# Patient Record
Sex: Male | Born: 1940 | Race: White | Hispanic: No | Smoking: Former smoker
Health system: Southern US, Community
[De-identification: ages and names within clinical notes are randomized; demographics above are authoritative.]

## PROBLEM LIST (undated history)

## (undated) DIAGNOSIS — I219 Acute myocardial infarction, unspecified: Secondary | ICD-10-CM

## (undated) HISTORY — PX: CARDIAC SURGERY: SHX584

## (undated) HISTORY — PX: BACK SURGERY: SHX140

---

## 2006-04-25 ENCOUNTER — Encounter: Admission: RE | Admit: 2006-04-25 | Discharge: 2006-04-25 | Payer: Self-pay | Admitting: Neurosurgery

## 2015-07-24 DIAGNOSIS — F32A Depression, unspecified: Secondary | ICD-10-CM | POA: Insufficient documentation

## 2015-07-24 DIAGNOSIS — K219 Gastro-esophageal reflux disease without esophagitis: Secondary | ICD-10-CM | POA: Diagnosis present

## 2015-07-24 DIAGNOSIS — I252 Old myocardial infarction: Secondary | ICD-10-CM | POA: Insufficient documentation

## 2015-07-24 DIAGNOSIS — F319 Bipolar disorder, unspecified: Secondary | ICD-10-CM | POA: Insufficient documentation

## 2015-07-24 DIAGNOSIS — E039 Hypothyroidism, unspecified: Secondary | ICD-10-CM | POA: Diagnosis present

## 2015-08-15 DIAGNOSIS — I2510 Atherosclerotic heart disease of native coronary artery without angina pectoris: Secondary | ICD-10-CM | POA: Diagnosis present

## 2015-08-15 DIAGNOSIS — J449 Chronic obstructive pulmonary disease, unspecified: Secondary | ICD-10-CM | POA: Diagnosis present

## 2015-08-15 DIAGNOSIS — I10 Essential (primary) hypertension: Secondary | ICD-10-CM | POA: Diagnosis present

## 2015-08-18 DIAGNOSIS — E785 Hyperlipidemia, unspecified: Secondary | ICD-10-CM | POA: Diagnosis present

## 2015-09-04 DIAGNOSIS — G2581 Restless legs syndrome: Secondary | ICD-10-CM | POA: Insufficient documentation

## 2015-09-04 DIAGNOSIS — M4800 Spinal stenosis, site unspecified: Secondary | ICD-10-CM | POA: Diagnosis present

## 2015-12-24 DIAGNOSIS — F3177 Bipolar disorder, in partial remission, most recent episode mixed: Secondary | ICD-10-CM | POA: Insufficient documentation

## 2017-02-10 DIAGNOSIS — G5603 Carpal tunnel syndrome, bilateral upper limbs: Secondary | ICD-10-CM | POA: Insufficient documentation

## 2018-06-23 DIAGNOSIS — N529 Male erectile dysfunction, unspecified: Secondary | ICD-10-CM | POA: Insufficient documentation

## 2019-07-19 DIAGNOSIS — Z955 Presence of coronary angioplasty implant and graft: Secondary | ICD-10-CM

## 2019-07-19 DIAGNOSIS — I252 Old myocardial infarction: Secondary | ICD-10-CM | POA: Insufficient documentation

## 2019-09-14 DIAGNOSIS — D529 Folate deficiency anemia, unspecified: Secondary | ICD-10-CM | POA: Insufficient documentation

## 2019-12-14 ENCOUNTER — Encounter (HOSPITAL_COMMUNITY): Payer: Self-pay | Admitting: *Deleted

## 2019-12-14 ENCOUNTER — Emergency Department (HOSPITAL_COMMUNITY): Payer: Medicare HMO

## 2019-12-14 ENCOUNTER — Inpatient Hospital Stay (HOSPITAL_COMMUNITY)
Admission: AD | Admit: 2019-12-14 | Discharge: 2019-12-16 | DRG: 872 | Disposition: A | Discharge status: Home / Self Care | Payer: Medicare HMO | Attending: Internal Medicine | Admitting: Internal Medicine

## 2019-12-14 DIAGNOSIS — E039 Hypothyroidism, unspecified: Secondary | ICD-10-CM | POA: Diagnosis present

## 2019-12-14 DIAGNOSIS — R05 Cough: Secondary | ICD-10-CM | POA: Diagnosis present

## 2019-12-14 DIAGNOSIS — Z66 Do not resuscitate: Secondary | ICD-10-CM | POA: Diagnosis present

## 2019-12-14 DIAGNOSIS — K8020 Calculus of gallbladder without cholecystitis without obstruction: Secondary | ICD-10-CM | POA: Diagnosis present

## 2019-12-14 DIAGNOSIS — E785 Hyperlipidemia, unspecified: Secondary | ICD-10-CM | POA: Diagnosis present

## 2019-12-14 DIAGNOSIS — J449 Chronic obstructive pulmonary disease, unspecified: Secondary | ICD-10-CM | POA: Diagnosis present

## 2019-12-14 DIAGNOSIS — Z79890 Hormone replacement therapy (postmenopausal): Secondary | ICD-10-CM

## 2019-12-14 DIAGNOSIS — A419 Sepsis, unspecified organism: Secondary | ICD-10-CM | POA: Diagnosis not present

## 2019-12-14 DIAGNOSIS — R112 Nausea with vomiting, unspecified: Secondary | ICD-10-CM

## 2019-12-14 DIAGNOSIS — Z20822 Contact with and (suspected) exposure to covid-19: Secondary | ICD-10-CM | POA: Diagnosis present

## 2019-12-14 DIAGNOSIS — F17200 Nicotine dependence, unspecified, uncomplicated: Secondary | ICD-10-CM | POA: Diagnosis present

## 2019-12-14 DIAGNOSIS — R197 Diarrhea, unspecified: Secondary | ICD-10-CM

## 2019-12-14 DIAGNOSIS — G2581 Restless legs syndrome: Secondary | ICD-10-CM | POA: Diagnosis present

## 2019-12-14 DIAGNOSIS — Z880 Allergy status to penicillin: Secondary | ICD-10-CM

## 2019-12-14 DIAGNOSIS — M4800 Spinal stenosis, site unspecified: Secondary | ICD-10-CM | POA: Diagnosis present

## 2019-12-14 DIAGNOSIS — K219 Gastro-esophageal reflux disease without esophagitis: Secondary | ICD-10-CM | POA: Diagnosis present

## 2019-12-14 DIAGNOSIS — R350 Frequency of micturition: Secondary | ICD-10-CM | POA: Diagnosis present

## 2019-12-14 DIAGNOSIS — Z955 Presence of coronary angioplasty implant and graft: Secondary | ICD-10-CM

## 2019-12-14 DIAGNOSIS — Z87440 Personal history of urinary (tract) infections: Secondary | ICD-10-CM

## 2019-12-14 DIAGNOSIS — R7401 Elevation of levels of liver transaminase levels: Secondary | ICD-10-CM | POA: Diagnosis present

## 2019-12-14 DIAGNOSIS — I2510 Atherosclerotic heart disease of native coronary artery without angina pectoris: Secondary | ICD-10-CM | POA: Diagnosis present

## 2019-12-14 DIAGNOSIS — I10 Essential (primary) hypertension: Secondary | ICD-10-CM | POA: Diagnosis present

## 2019-12-14 DIAGNOSIS — I252 Old myocardial infarction: Secondary | ICD-10-CM

## 2019-12-14 DIAGNOSIS — R509 Fever, unspecified: Secondary | ICD-10-CM | POA: Diagnosis not present

## 2019-12-14 DIAGNOSIS — Z888 Allergy status to other drugs, medicaments and biological substances status: Secondary | ICD-10-CM

## 2019-12-14 DIAGNOSIS — R399 Unspecified symptoms and signs involving the genitourinary system: Secondary | ICD-10-CM

## 2019-12-14 DIAGNOSIS — A071 Giardiasis [lambliasis]: Secondary | ICD-10-CM | POA: Diagnosis present

## 2019-12-14 DIAGNOSIS — R1013 Epigastric pain: Secondary | ICD-10-CM | POA: Diagnosis not present

## 2019-12-14 DIAGNOSIS — Z79899 Other long term (current) drug therapy: Secondary | ICD-10-CM | POA: Diagnosis not present

## 2019-12-14 DIAGNOSIS — R059 Cough, unspecified: Secondary | ICD-10-CM

## 2019-12-14 HISTORY — DX: Acute myocardial infarction, unspecified: I21.9

## 2019-12-14 LAB — CBC
HCT: 39.8 % (ref 39.0–52.0)
Hemoglobin: 14.4 g/dL (ref 13.0–17.0)
MCH: 36.8 pg — ABNORMAL HIGH (ref 26.0–34.0)
MCHC: 36.2 g/dL — ABNORMAL HIGH (ref 30.0–36.0)
MCV: 101.8 fL — ABNORMAL HIGH (ref 80.0–100.0)
Platelets: 125 10*3/uL — ABNORMAL LOW (ref 150–400)
RBC: 3.91 MIL/uL — ABNORMAL LOW (ref 4.22–5.81)
RDW: 13.2 % (ref 11.5–15.5)
WBC: 10.2 10*3/uL (ref 4.0–10.5)
nRBC: 0 % (ref 0.0–0.2)

## 2019-12-14 LAB — COMPREHENSIVE METABOLIC PANEL
ALT: 58 U/L — ABNORMAL HIGH (ref 0–44)
AST: 61 U/L — ABNORMAL HIGH (ref 15–41)
Albumin: 3.6 g/dL (ref 3.5–5.0)
Alkaline Phosphatase: 46 U/L (ref 38–126)
Anion gap: 11 (ref 5–15)
BUN: 18 mg/dL (ref 8–23)
CO2: 26 mmol/L (ref 22–32)
Calcium: 8.3 mg/dL — ABNORMAL LOW (ref 8.9–10.3)
Chloride: 94 mmol/L — ABNORMAL LOW (ref 98–111)
Creatinine, Ser: 1.19 mg/dL (ref 0.61–1.24)
GFR calc Af Amer: >60 mL/min (ref >60)
GFR calc non Af Amer: 57 mL/min — ABNORMAL LOW (ref >60)
Glucose, Bld: 146 mg/dL — ABNORMAL HIGH (ref 70–99)
Potassium: 3.7 mmol/L (ref 3.5–5.1)
Sodium: 131 mmol/L — ABNORMAL LOW (ref 135–145)
Total Bilirubin: 1.4 mg/dL — ABNORMAL HIGH (ref 0.3–1.2)
Total Protein: 6.5 g/dL (ref 6.5–8.1)

## 2019-12-14 LAB — LACTIC ACID, PLASMA
Lactic Acid, Venous: 2.4 mmol/L (ref 0.5–1.9)
Lactic Acid, Venous: 0.9 mmol/L (ref 0.5–1.9)

## 2019-12-14 LAB — URINALYSIS, ROUTINE W REFLEX MICROSCOPIC
Bilirubin Urine: NEGATIVE
Glucose, UA: NEGATIVE mg/dL
Hgb urine dipstick: NEGATIVE
Ketones, ur: NEGATIVE mg/dL
Leukocytes,Ua: NEGATIVE
Nitrite: NEGATIVE
Protein, ur: NEGATIVE mg/dL
Specific Gravity, Urine: 1.018 (ref 1.005–1.030)
pH: 5 (ref 5.0–8.0)

## 2019-12-14 MED ORDER — SODIUM CHLORIDE 0.9 % IV BOLUS
1000.0000 mL | Freq: Once | INTRAVENOUS | Status: AC
  Administered 2019-12-14: 1000 mL via INTRAVENOUS

## 2019-12-14 MED ORDER — LACTATED RINGERS IV BOLUS (SEPSIS)
500.0000 mL | Freq: Once | INTRAVENOUS | Status: AC
  Administered 2019-12-14: 500 mL via INTRAVENOUS

## 2019-12-14 MED ORDER — SODIUM CHLORIDE 0.9 % IV SOLN
1.0000 g | INTRAVENOUS | Status: DC
  Administered 2019-12-14: 1 g via INTRAVENOUS
  Filled 2019-12-14: qty 10

## 2019-12-14 MED ORDER — LACTATED RINGERS IV BOLUS (SEPSIS)
1000.0000 mL | Freq: Once | INTRAVENOUS | Status: AC
  Administered 2019-12-14: 1000 mL via INTRAVENOUS

## 2019-12-14 MED ORDER — VANCOMYCIN HCL IN DEXTROSE 1-5 GM/200ML-% IV SOLN
1000.0000 mg | Freq: Once | INTRAVENOUS | Status: AC
  Administered 2019-12-14: 1000 mg via INTRAVENOUS
  Filled 2019-12-14: qty 200

## 2019-12-14 MED ORDER — ONDANSETRON HCL 4 MG/2ML IJ SOLN
4.0000 mg | Freq: Once | INTRAMUSCULAR | Status: AC
  Administered 2019-12-14: 4 mg via INTRAVENOUS
  Filled 2019-12-14: qty 2

## 2019-12-14 MED ORDER — IOHEXOL 300 MG/ML  SOLN
100.0000 mL | Freq: Once | INTRAMUSCULAR | Status: AC | PRN
  Administered 2019-12-14: 100 mL via INTRAVENOUS

## 2019-12-14 MED ORDER — METRONIDAZOLE IN NACL 5-0.79 MG/ML-% IV SOLN
500.0000 mg | Freq: Once | INTRAVENOUS | Status: AC
  Administered 2019-12-14: 500 mg via INTRAVENOUS
  Filled 2019-12-14: qty 100

## 2019-12-14 MED ORDER — SODIUM CHLORIDE 0.9 % IV SOLN
2.0000 g | Freq: Once | INTRAVENOUS | Status: AC
  Administered 2019-12-14: 2 g via INTRAVENOUS
  Filled 2019-12-14: qty 2

## 2019-12-14 MED ORDER — SODIUM CHLORIDE (PF) 0.9 % IJ SOLN
INTRAMUSCULAR | Status: AC
  Filled 2019-12-14: qty 50

## 2019-12-14 NOTE — ED Notes (Signed)
Pt transported to CT ?

## 2019-12-14 NOTE — Progress Notes (Signed)
A consult was received from an ED physician for cefepime and vancomycin per pharmacy dosing.  The patient's profile has been reviewed for ht/wt/allergies/indication/available labs.   A one time order has been placed for cefepime 2g and vancomycin 1g.  Further antibiotics/pharmacy consults should be ordered by admitting physician if indicated.                       Thank you, Loralee Pacas, PharmD, BCPS Pharmacy: [phone] 12/14/2019  10:51 PM

## 2019-12-14 NOTE — ED Provider Notes (Addendum)
[facility]-EMERGENCY DEPT Provider Note   CSN: [id number] Arrival date & time: 12/14/19  1429     History Chief Complaint  Patient presents with  . Fever  . Cough  . Emesis  . Diarrhea  . Dysuria    Joshua Mcdaniel is a 79 y.o. male.  Patient c/o fever, and urinary symptoms in the past 3-4 days. Symptoms acute onset, moderate, persistent, slowly worsening. Notes remote hx uti. Denies recent abx use. + mild urinary urgency, dysuria. No scrotal or testicular pain. Also c/o diffuse, crampy abd pain. No focal or unilateral  Pain. Also notes intermittent nausea/vomiting/diarrhea - denies known bad food ingestion or ill contacts. No recent abx use. Patient also c/o increased cough, mild sob. No known covid exposure.  Fever to 101.  Patient with multiple symptoms, but states unsure what exactly is causing fever.   The history is provided by the patient and the EMS personnel.  Urinary Frequency Associated symptoms include abdominal pain and shortness of breath. Pertinent negatives include no chest pain and no headaches.  Fever Associated symptoms: cough, diarrhea, dysuria and vomiting   Associated symptoms: no chest pain, no confusion, no headaches, no rash and no sore throat        Past Medical History:  Diagnosis Date  . Myocardial infarct [facility])     Patient Active Problem List   Diagnosis Date Noted  . Anemia due to folic acid deficiency 09/14/2019  . H/O heart artery stent 07/19/2019  . Old MI (myocardial infarction) 07/19/2019  . Erectile dysfunction 06/23/2018  . Bilateral carpal tunnel syndrome 02/10/2017  . Bipolar disorder, in partial remission, most recent episode mixed (HCC) 12/24/2015  . Restless legs syndrome (RLS) 09/04/2015  . Spinal stenosis 09/04/2015  . Hyperlipidemia 08/18/2015  . Atherosclerotic heart disease of native coronary artery without angina pectoris 08/15/2015  . COPD (chronic obstructive pulmonary disease) (HCC) 08/15/2015  .  Essential hypertension 08/15/2015  . Bipolar affective disorder (HCC) 07/24/2015  . Depression 07/24/2015  . Gastro-esophageal reflux disease without esophagitis 07/24/2015  . History of MI (myocardial infarction) 07/24/2015  . Hypothyroidism 07/24/2015  . Nontraumatic rupture of tendons of biceps (long head) 03/04/2010    Past Surgical History:  Procedure Laterality Date  . BACK SURGERY    . CARDIAC SURGERY         History reviewed. No pertinent family history.  Social History   Tobacco Use  . Smoking status: Former Games developer  . Smokeless tobacco: Current User  Substance Use Topics  . Alcohol use: Not Currently  . Drug use: Never    Home Medications Prior to Admission medications   Medication Sig Start Date End Date Taking? Authorizing Provider  folic acid (FOLVITE) 1 MG tablet Take 1 mg by mouth daily. 09/17/19  Yes [provider]  furosemide (LASIX) 20 MG tablet Take 20 mg by mouth daily. 09/18/19  Yes [provider]  levothyroxine (SYNTHROID) 25 MCG tablet Take 25 mcg by mouth daily. 10/09/19  Yes [provider]  losartan (COZAAR) 25 MG tablet Take 25 mg by mouth daily. 12/11/19  Yes [provider]  metoprolol tartrate (LOPRESSOR) 25 MG tablet Take 12.5 mg by mouth 2 (two) times daily.  09/18/19  Yes [provider]  nitroGLYCERIN (NITROSTAT) 0.4 MG SL tablet Place 0.4 mg under the tongue every 5 (five) minutes as needed for chest pain.  07/20/19  Yes [provider]  simvastatin (ZOCOR) 40 MG tablet Take 40 mg by mouth at bedtime.  11/11/19  Yes [provider]  traZODone (DESYREL) 150 MG tablet Take 150 mg by mouth at bedtime as needed for sleep.  11/14/19  Yes [provider]  omeprazole (PRILOSEC) 20 MG capsule Take 20 mg by mouth daily as needed (indigestion).  06/20/19   [provider]    Allergies    Lisinopril and Penicillins  Review of Systems   Review of Systems  Constitutional:  Positive for fever.  HENT: Negative for sore throat.   Eyes: Negative for redness.  Respiratory: Positive for cough and shortness of breath.   Cardiovascular: Negative for chest pain.  Gastrointestinal: Positive for abdominal pain, diarrhea and vomiting.  Genitourinary: Positive for dysuria. Negative for flank pain and frequency.  Musculoskeletal: Negative for back pain, neck pain and neck stiffness.  Skin: Negative for rash.  Neurological: Negative for headaches.  Hematological: Does not bruise/bleed easily.  Psychiatric/Behavioral: Negative for confusion.    Physical Exam Updated Vital Signs BP 111/85   Pulse (!) 59   Temp 98.5 F (36.9 C) (Oral)   Resp (!) 25   Ht 1.651 m (5\' 5" )   Wt 78.5 kg   SpO2 93%   BMI 28.79 kg/m   Physical Exam Vitals and nursing note reviewed.  Constitutional:      Appearance: Normal appearance. He is well-developed.     Comments: +febrile.  HENT:     Head: Atraumatic.     Nose: Nose normal.     Mouth/Throat:     Mouth: Mucous membranes are moist.     Pharynx: Oropharynx is clear.  Eyes:     General: No scleral icterus.    Conjunctiva/sclera: Conjunctivae normal.  Neck:     Trachea: No tracheal deviation.  Cardiovascular:     Rate and Rhythm: Normal rate and regular rhythm.     Pulses: Normal pulses.     Heart sounds: Normal heart sounds. No murmur heard.  No friction rub. No gallop.   Pulmonary:     Effort: Pulmonary effort is normal. No accessory muscle usage or respiratory distress.     Breath sounds: Rhonchi present.  Abdominal:     General: Bowel sounds are normal. There is no distension.     Palpations: Abdomen is soft.     Tenderness: There is abdominal tenderness. There is no guarding or rebound.     Comments: Mild-mod diffuse tenderness.   Genitourinary:    Comments: No cva tenderness. Normal external gu exam, no scrotal or testicular pain, swelling, or tenderness.  Musculoskeletal:        General: No swelling or  tenderness.     Cervical back: Normal range of motion and neck supple. No rigidity.  Skin:    General: Skin is warm and dry.     Findings: No rash.  Neurological:     Mental Status: He is alert.     Comments: Alert, speech clear.   Psychiatric:        Mood and Affect: Mood normal.     ED Results / Procedures / Treatments   Labs (all labs ordered are listed, but only abnormal results are displayed) Results for orders placed or performed during the hospital encounter of 12/14/19  Lactic acid, plasma  Result Value Ref Range   Lactic Acid, Venous 2.4 (HH) 0.5 - 1.9 mmol/L  Comprehensive metabolic panel  Result Value Ref Range   Sodium 131 (L) 135 - 145 mmol/L   Potassium 3.7 3.5 - 5.1 mmol/L   Chloride 94 (L)  98 - 111 mmol/L   CO2 26 22 - 32 mmol/L   Glucose, Bld 146 (H) 70 - 99 mg/dL   BUN 18 8 - 23 mg/dL   Creatinine, Ser 5.46 0.61 - 1.24 mg/dL   Calcium 8.3 (L) 8.9 - 10.3 mg/dL   Total Protein 6.5 6.5 - 8.1 g/dL   Albumin 3.6 3.5 - 5.0 g/dL   AST 61 (H) 15 - 41 U/L   ALT 58 (H) 0 - 44 U/L   Alkaline Phosphatase 46 38 - 126 U/L   Total Bilirubin 1.4 (H) 0.3 - 1.2 mg/dL   GFR calc non Af Amer 57 (L) >60 mL/min   GFR calc Af Amer >60 >60 mL/min   Anion gap 11 5 - 15  CBC  Result Value Ref Range   WBC 10.2 4.0 - 10.5 K/uL   RBC 3.91 (L) 4.22 - 5.81 MIL/uL   Hemoglobin 14.4 13.0 - 17.0 g/dL   HCT 27.0 39 - 52 %   MCV 101.8 (H) 80.0 - 100.0 fL   MCH 36.8 (H) 26.0 - 34.0 pg   MCHC 36.2 (H) 30.0 - 36.0 g/dL   RDW 35.0 09.3 - 81.8 %   Platelets 125 (L) 150 - 400 K/uL   nRBC 0.0 0.0 - 0.2 %  Urinalysis, Routine w reflex microscopic  Result Value Ref Range   Color, Urine YELLOW YELLOW   APPearance CLEAR CLEAR   Specific Gravity, Urine 1.018 1.005 - 1.030   pH 5.0 5.0 - 8.0   Glucose, UA NEGATIVE NEGATIVE mg/dL   Hgb urine dipstick NEGATIVE NEGATIVE   Bilirubin Urine NEGATIVE NEGATIVE   Ketones, ur NEGATIVE NEGATIVE mg/dL   Protein, ur NEGATIVE NEGATIVE mg/dL    Nitrite NEGATIVE NEGATIVE   Leukocytes,Ua NEGATIVE NEGATIVE  Lactic acid, plasma  Result Value Ref Range   Lactic Acid, Venous 0.9 0.5 - 1.9 mmol/L   CT Abdomen Pelvis W Contrast  Result Date: 12/14/2019 CLINICAL DATA:  Fever, weakness, nausea, vomiting, and diarrhea. EXAM: CT ABDOMEN AND PELVIS WITH CONTRAST TECHNIQUE: Multidetector CT imaging of the abdomen and pelvis was performed using the standard protocol following bolus administration of intravenous contrast. CONTRAST:  OMNIPAQUE IOHEXOL 300 MG/ML  SOLN COMPARISON:  CT abdomen pelvis dated April 10, 2018. FINDINGS: Lower chest: No acute abnormality. Hepatobiliary: No focal liver abnormality is seen. Mild periportal edema. Small gallstones. No gallbladder wall thickening or biliary dilatation. Pancreas: Unremarkable. No pancreatic ductal dilatation or surrounding inflammatory changes. Spleen: Normal in size without focal abnormality. Adrenals/Urinary Tract: Adrenal glands are unremarkable. Unchanged small left renal cysts. Chronic scarring in both upper poles again noted. No renal calculi or hydronephrosis. Bladder is unremarkable for the degree of distention. Stomach/Bowel: Stomach is within normal limits. Diminutive or absent appendix. No evidence of bowel wall thickening, distention, or inflammatory changes. Mild left-sided colonic diverticulosis. Vascular/Lymphatic: Aortic atherosclerosis. No enlarged abdominal or pelvic lymph nodes. Reproductive: Prostate is unremarkable. Other: Unchanged small fat containing left greater than right inguinal hernias. No free fluid or pneumoperitoneum. Musculoskeletal: No acute or significant osseous findings. Prior lumbar fusion. Chronic L2 compression deformity. IMPRESSION: 1. No acute intra-abdominal process. 2. Unchanged cholelithiasis. 3. Aortic Atherosclerosis (ICD10-I70.0). Electronically Signed   By: Obie Dredge M.D.   On: 12/14/2019 20:20   US Abdomen Limited  Result Date:  12/14/2019 CLINICAL DATA:  Fever EXAM: ULTRASOUND ABDOMEN LIMITED RIGHT UPPER QUADRANT COMPARISON:  CT today FINDINGS: Gallbladder: Small stones seen within the gallbladder. No wall thickening or sonographic Murphy's sign. Small  amount of pericholecystic fluid suspected. Common bile duct: Diameter: Normal caliber, 4 mm Liver: No focal lesion identified. Within normal limits in parenchymal echogenicity. Portal vein is patent on color Doppler imaging with normal direction of blood flow towards the liver. Other: None. IMPRESSION: Cholelithiasis. There is no wall thickening or sonographic Murphy's sign. There appears to be a small amount of pericholecystic fluid. Recommend clinical correlation to completely exclude acute cholecystitis. Electronically Signed   By: Charlett Nose M.D.   On: 12/14/2019 21:47   DG Chest Port 1 View  Result Date: 12/14/2019 CLINICAL DATA:  Fever.  Weakness and headache.  Urinary urgency. EXAM: PORTABLE CHEST 1 VIEW COMPARISON:  Radiograph 12/13/2018 FINDINGS: Upper normal heart size. Coronary stent is visualized. Normal mediastinal contours. Streaky opacities at the right lung base. No pulmonary edema. No pleural fluid or pneumothorax. No acute osseous abnormalities are seen. IMPRESSION: 1. Streaky right lung base opacities, favor atelectasis, however atypical viral pneumonia could have a similar appearance. 2. Borderline cardiomegaly.  Coronary stent. Electronically Signed   By: Narda Rutherford M.D.   On: 12/14/2019 19:58     EKG None  Radiology CT Abdomen Pelvis W Contrast  Result Date: 12/14/2019 CLINICAL DATA:  Fever, weakness, nausea, vomiting, and diarrhea. EXAM: CT ABDOMEN AND PELVIS WITH CONTRAST TECHNIQUE: Multidetector CT imaging of the abdomen and pelvis was performed using the standard protocol following bolus administration of intravenous contrast. CONTRAST:  OMNIPAQUE IOHEXOL 300 MG/ML  SOLN COMPARISON:  CT abdomen pelvis dated April 10, 2018. FINDINGS:  Lower chest: No acute abnormality. Hepatobiliary: No focal liver abnormality is seen. Mild periportal edema. Small gallstones. No gallbladder wall thickening or biliary dilatation. Pancreas: Unremarkable. No pancreatic ductal dilatation or surrounding inflammatory changes. Spleen: Normal in size without focal abnormality. Adrenals/Urinary Tract: Adrenal glands are unremarkable. Unchanged small left renal cysts. Chronic scarring in both upper poles again noted. No renal calculi or hydronephrosis. Bladder is unremarkable for the degree of distention. Stomach/Bowel: Stomach is within normal limits. Diminutive or absent appendix. No evidence of bowel wall thickening, distention, or inflammatory changes. Mild left-sided colonic diverticulosis. Vascular/Lymphatic: Aortic atherosclerosis. No enlarged abdominal or pelvic lymph nodes. Reproductive: Prostate is unremarkable. Other: Unchanged small fat containing left greater than right inguinal hernias. No free fluid or pneumoperitoneum. Musculoskeletal: No acute or significant osseous findings. Prior lumbar fusion. Chronic L2 compression deformity. IMPRESSION: 1. No acute intra-abdominal process. 2. Unchanged cholelithiasis. 3. Aortic Atherosclerosis (ICD10-I70.0). Electronically Signed   By: Obie Dredge M.D.   On: 12/14/2019 20:20   US Abdomen Limited  Result Date: 12/14/2019 CLINICAL DATA:  Fever EXAM: ULTRASOUND ABDOMEN LIMITED RIGHT UPPER QUADRANT COMPARISON:  CT today FINDINGS: Gallbladder: Small stones seen within the gallbladder. No wall thickening or sonographic Murphy's sign. Small amount of pericholecystic fluid suspected. Common bile duct: Diameter: Normal caliber, 4 mm Liver: No focal lesion identified. Within normal limits in parenchymal echogenicity. Portal vein is patent on color Doppler imaging with normal direction of blood flow towards the liver. Other: None. IMPRESSION: Cholelithiasis. There is no wall thickening or sonographic Murphy's sign. There  appears to be a small amount of pericholecystic fluid. Recommend clinical correlation to completely exclude acute cholecystitis. Electronically Signed   By: Charlett Nose M.D.   On: 12/14/2019 21:47   DG Chest Port 1 View  Result Date: 12/14/2019 CLINICAL DATA:  Fever.  Weakness and headache.  Urinary urgency. EXAM: PORTABLE CHEST 1 VIEW COMPARISON:  Radiograph 12/13/2018 FINDINGS: Upper normal heart size. Coronary stent is visualized. Normal mediastinal contours.  Streaky opacities at the right lung base. No pulmonary edema. No pleural fluid or pneumothorax. No acute osseous abnormalities are seen. IMPRESSION: 1. Streaky right lung base opacities, favor atelectasis, however atypical viral pneumonia could have a similar appearance. 2. Borderline cardiomegaly.  Coronary stent. Electronically Signed   By: Keith Rake M.D.   On: 12/14/2019 19:58    Procedures Procedures (including critical care time)  Medications Ordered in ED Medications  cefTRIAXone (ROCEPHIN) 1 g in sodium chloride 0.9 % 100 mL IVPB (0 g Intravenous Stopped 12/14/19 1828)  sodium chloride (PF) 0.9 % injection (has no administration in time range)  ceFEPIme (MAXIPIME) 2 g in sodium chloride 0.9 % 100 mL IVPB (has no administration in time range)  vancomycin (VANCOCIN) IVPB 1000 mg/200 mL premix (has no administration in time range)  metroNIDAZOLE (FLAGYL) IVPB 500 mg (has no administration in time range)  sodium chloride 0.9 % bolus 1,000 mL (0 mLs Intravenous Stopped 12/14/19 1712)  sodium chloride 0.9 % bolus 1,000 mL (0 mLs Intravenous Stopped 12/14/19 1828)  lactated ringers bolus 1,000 mL (0 mLs Intravenous Stopped 12/14/19 2041)    And  lactated ringers bolus 1,000 mL (0 mLs Intravenous Stopped 12/14/19 2041)    And  lactated ringers bolus 500 mL (0 mLs Intravenous Stopped 12/14/19 2041)  iohexol (OMNIPAQUE) 300 MG/ML solution 100 mL (100 mLs Intravenous Contrast Given 12/14/19 1953)    ED Course  I have reviewed the  triage vital signs and the nursing notes.  Pertinent labs & imaging results that were available during my care of the patient were reviewed by me and considered in my medical decision making (see chart for details).    MDM Rules/Calculators/A&P                          Iv ns bolus. Stat labs. UA.   Reviewed nursing notes and prior charts for additional history.   Urine culture sent.   Labs reviewed/interpreted by me - chem normal. Initial lactate elevated. Ns bolus.   Additional labs reviewed/interpreted by me - ua neg for infection. + abd tenderness - will get ct.   CXR reviewed/interpreted by me - ? Basilar atelectasis vs pna.   CT reviewed/interpreted by me - no acute infection noted, +gallstones. Will get u/s to check for cholecystitis.   U/s reviewed/interpreted by me - gallstones, small amount pericholecystic fluid - on recheck, no focal ruq pain or tenderness on exam.   Repeat lactate is normal.  After ED workup, still not entirely clear source of fever as pt with both respiratory symptoms, and gi symptoms.  Will cover with iv abx, and plan for admission/obs. covid remains pending.        Final Clinical Impression(s) / ED Diagnoses Final diagnoses:  None    Rx / DC Orders ED Discharge Orders    None           Lajean Saver, MD 12/14/19 2230

## 2019-12-14 NOTE — ED Triage Notes (Signed)
Per EMS, pt from home complains of weakness, headache, increased urgency & frequent urination x 4 days. Pt also had n/v/d. Fever 103 w/ EMS, 1g tylenol and 500cc NS.

## 2019-12-14 NOTE — H&P (Signed)
History and Physical    Joshua Mcdaniel VOZ:[id number] DOB: [birth year] DOA: 12/14/2019  PCP: Francesca Oman, DO  Patient coming from: Home  I have personally briefly reviewed patient's old medical records in [place]  Chief Complaint: Fever  HPI: Joshua Mcdaniel is a 79 y.o. male with medical history significant of hypertension, hypothyroid, coronary artery disease who presents to the ER with a 5-day history of diarrhea, nausea.  The man began to develop fever and some cough.  He specifically denies urinary urgency or dysuria.  He denies any sick contacts or known Covid exposure, though he reports his daughter does not feel well either.  Has been vaccinated against Covid in March.  Reports fever to 103 this a.m. ED Course: In the ED he was found to have gallstones with some pericholecystic fluid, but no Murphy sign, gallbladder wall thickening, or dilated ducts.  Urinalysis was completely normal.  Chest x-ray showed right basilar infiltrate versus atelectasis.  He was noted to have a lactic acid of 2.4 was given IV fluid hydration and that came down to 0.9.  He was also started on broad-spectrum antibiotics at that time to include cefepime, vancomycin, and Flagyl.  We are asked to admit for sepsis.  Review of Systems: As per HPI otherwise 10 point review of systems negative.   Past Medical History:  Diagnosis Date  . Myocardial infarct [facility])     Past Surgical History:  Procedure Laterality Date  . BACK SURGERY    . CARDIAC SURGERY       reports that he has quit smoking. He uses smokeless tobacco. He reports previous alcohol use. He reports that he does not use drugs.  Allergies  Allergen Reactions  . Lisinopril Cough  . Penicillins Diarrhea    Did it involve swelling of the face/tongue/throat, SOB, or low BP? N Did it involve sudden or severe rash/hives, skin peeling, or any reaction on the inside of your mouth or nose? No but caused nausea, vomiting and diarrhea Did you  need to seek medical attention at a hospital or doctor's office? Unknown When did it last happen?Several Years Ago  If all above answers are "NO", may proceed with cephalosporin use.      History reviewed. No pertinent family history.   Prior to Admission medications   Medication Sig Start Date End Date Taking? Authorizing Provider  folic acid (FOLVITE) 1 MG tablet Take 1 mg by mouth daily. 09/17/19  Yes [provider]  furosemide (LASIX) 20 MG tablet Take 20 mg by mouth daily. 09/18/19  Yes [provider]  levothyroxine (SYNTHROID) 25 MCG tablet Take 25 mcg by mouth daily. 10/09/19  Yes [provider]  losartan (COZAAR) 25 MG tablet Take 25 mg by mouth daily. 12/11/19  Yes [provider]  metoprolol tartrate (LOPRESSOR) 25 MG tablet Take 12.5 mg by mouth 2 (two) times daily.  09/18/19  Yes [provider]  nitroGLYCERIN (NITROSTAT) 0.4 MG SL tablet Place 0.4 mg under the tongue every 5 (five) minutes as needed for chest pain.  07/20/19  Yes [provider]  simvastatin (ZOCOR) 40 MG tablet Take 40 mg by mouth at bedtime. 11/11/19  Yes [provider]  traZODone (DESYREL) 150 MG tablet Take 150 mg by mouth at bedtime as needed for sleep.  11/14/19  Yes [provider]  omeprazole (PRILOSEC) 20 MG capsule Take 20 mg by mouth daily as needed (indigestion).  06/20/19   [provider]  Physical Exam: Vitals:   12/14/19 1819 12/14/19 1904 12/14/19 2038 12/14/19 2248  BP: (!) 98/58 108/64 111/85 (!) 142/89  Pulse: 60 71 (!) 59 (!) 105  Resp: (!) 24 (!) 25 (!) 25 (!) 24  Temp:      TempSrc:      SpO2: 100% 96% 93% 96%  Weight:      Height:        Constitutional: NAD, calm, comfortable Vitals:   12/14/19 1819 12/14/19 1904 12/14/19 2038 12/14/19 2248  BP: (!) 98/58 108/64 111/85 (!) 142/89  Pulse: 60 71 (!) 59 (!) 105  Resp: (!) 24 (!) 25 (!) 25 (!) 24  Temp:      TempSrc:      SpO2: 100% 96% 93% 96%   Weight:      Height:       Eyes: PERRL, lids and conjunctivae normal ENMT: Mucous membranes are moist. Posterior pharynx clear of any exudate or lesions. Neck: normal, supple, no masses, no thyromegaly Respiratory: clear to auscultation on the left no wheezing, rales in the right lung base. Normal respiratory effort. No accessory muscle use.  Cardiovascular: Regular rate and rhythm, no murmurs / rubs / gallops. No extremity edema. 2+ pedal pulses. No carotid bruits.  Abdomen: no tenderness, no masses palpated. No hepatosplenomegaly. Bowel sounds positive.  Musculoskeletal: no clubbing / cyanosis. No joint deformity upper and lower extremities. Good ROM, no contractures. Normal muscle tone.  Skin: no rashes, lesions, ulcers. No induration Neurologic: Grossly normal Psychiatric: Normal judgment and insight. Alert and oriented x 3. Normal mood.   Labs on Admission: I have personally reviewed following labs and imaging studies  CBC: Recent Labs  Lab 12/14/19 1526  WBC 10.2  HGB 14.4  HCT 39.8  MCV 101.8*  PLT 125*   Basic Metabolic Panel: Recent Labs  Lab 12/14/19 1526  NA 131*  K 3.7  CL 94*  CO2 26  GLUCOSE 146*  BUN 18  CREATININE 1.19  CALCIUM 8.3*   GFR: Estimated Creatinine Clearance: 47 mL/min (by C-G formula based on SCr of 1.19 mg/dL). Liver Function Tests: Recent Labs  Lab 12/14/19 1526  AST 61*  ALT 58*  ALKPHOS 46  BILITOT 1.4*  PROT 6.5  ALBUMIN 3.6   Urine analysis:    Component Value Date/Time   COLORURINE YELLOW 12/14/2019 1704   APPEARANCEUR CLEAR 12/14/2019 1704   LABSPEC 1.018 12/14/2019 1704   PHURINE 5.0 12/14/2019 1704   GLUCOSEU NEGATIVE 12/14/2019 1704   HGBUR NEGATIVE 12/14/2019 1704   BILIRUBINUR NEGATIVE 12/14/2019 1704   KETONESUR NEGATIVE 12/14/2019 1704   PROTEINUR NEGATIVE 12/14/2019 1704   NITRITE NEGATIVE 12/14/2019 1704   LEUKOCYTESUR NEGATIVE 12/14/2019 1704    Radiological Exams on Admission: CT Abdomen Pelvis W  Contrast  Result Date: 12/14/2019 CLINICAL DATA:  Fever, weakness, nausea, vomiting, and diarrhea. EXAM: CT ABDOMEN AND PELVIS WITH CONTRAST TECHNIQUE: Multidetector CT imaging of the abdomen and pelvis was performed using the standard protocol following bolus administration of intravenous contrast. CONTRAST:  OMNIPAQUE IOHEXOL 300 MG/ML  SOLN COMPARISON:  CT abdomen pelvis dated April 10, 2018. FINDINGS: Lower chest: No acute abnormality. Hepatobiliary: No focal liver abnormality is seen. Mild periportal edema. Small gallstones. No gallbladder wall thickening or biliary dilatation. Pancreas: Unremarkable. No pancreatic ductal dilatation or surrounding inflammatory changes. Spleen: Normal in size without focal abnormality. Adrenals/Urinary Tract: Adrenal glands are unremarkable. Unchanged small left renal cysts. Chronic scarring in both upper poles again noted. No renal calculi or hydronephrosis.  Bladder is unremarkable for the degree of distention. Stomach/Bowel: Stomach is within normal limits. Diminutive or absent appendix. No evidence of bowel wall thickening, distention, or inflammatory changes. Mild left-sided colonic diverticulosis. Vascular/Lymphatic: Aortic atherosclerosis. No enlarged abdominal or pelvic lymph nodes. Reproductive: Prostate is unremarkable. Other: Unchanged small fat containing left greater than right inguinal hernias. No free fluid or pneumoperitoneum. Musculoskeletal: No acute or significant osseous findings. Prior lumbar fusion. Chronic L2 compression deformity. IMPRESSION: 1. No acute intra-abdominal process. 2. Unchanged cholelithiasis. 3. Aortic Atherosclerosis (ICD10-I70.0). Electronically Signed   By: Obie Dredge M.D.   On: 12/14/2019 20:20   US Abdomen Limited  Result Date: 12/14/2019 CLINICAL DATA:  Fever EXAM: ULTRASOUND ABDOMEN LIMITED RIGHT UPPER QUADRANT COMPARISON:  CT today FINDINGS: Gallbladder: Small stones seen within the gallbladder. No wall thickening  or sonographic Murphy's sign. Small amount of pericholecystic fluid suspected. Common bile duct: Diameter: Normal caliber, 4 mm Liver: No focal lesion identified. Within normal limits in parenchymal echogenicity. Portal vein is patent on color Doppler imaging with normal direction of blood flow towards the liver. Other: None. IMPRESSION: Cholelithiasis. There is no wall thickening or sonographic Murphy's sign. There appears to be a small amount of pericholecystic fluid. Recommend clinical correlation to completely exclude acute cholecystitis. Electronically Signed   By: Charlett Nose M.D.   On: 12/14/2019 21:47   DG Chest Port 1 View  Result Date: 12/14/2019 CLINICAL DATA:  Fever.  Weakness and headache.  Urinary urgency. EXAM: PORTABLE CHEST 1 VIEW COMPARISON:  Radiograph 12/13/2018 FINDINGS: Upper normal heart size. Coronary stent is visualized. Normal mediastinal contours. Streaky opacities at the right lung base. No pulmonary edema. No pleural fluid or pneumothorax. No acute osseous abnormalities are seen. IMPRESSION: 1. Streaky right lung base opacities, favor atelectasis, however atypical viral pneumonia could have a similar appearance. 2. Borderline cardiomegaly.  Coronary stent. Electronically Signed   By: Narda Rutherford M.D.   On: 12/14/2019 19:58      Assessment/Plan Active Problems:   Atherosclerotic heart disease of native coronary artery without angina pectoris   COPD (chronic obstructive pulmonary disease) (HCC)   Essential hypertension   Gastro-esophageal reflux disease without esophagitis   H/O heart artery stent   Hyperlipidemia   Hypothyroidism   Spinal stenosis  Sepsis: Has been vaccinated for Covid test is pending. Blood cultures and urine cultures are pending as well given physical exam findings as well as x-ray findings will presume pneumonia and treat as such.  Have begun Rocephin and azithromycin for coverage.  Urinary pathogens are pending  Cholelithiasis: There is  no significant tenderness at the gallbladder.  Should this worsen or declare itself would need surgical consultation.  Hypertension: Continue Cozaar and Lopressor  Coronary artery disease: Continue aspirin, nitroglycerin as needed  GERD: Continue PPI  Hyperlipidemia: Continue Zocor  Hypothyroidism: Continue Synthroid  DVT prophylaxis: Lovenox SQ Code Status: DNR reviewed with the patient Family Communication: His daughter is not answering the phone he cannot remember his son-in-law's number hopefully will get in touch tomorrow Disposition Plan: Home Consults called: None Admission status: Inpatient requires IV antibiotics   Reva Bores MD [facility]  If 7PM-7AM, please contact night-coverage 12/14/2019, 11:27 PM

## 2019-12-14 NOTE — Progress Notes (Signed)
Notified bedside nurse of need to draw repeat lactic acid. 

## 2019-12-15 ENCOUNTER — Other Ambulatory Visit: Payer: Self-pay

## 2019-12-15 DIAGNOSIS — K219 Gastro-esophageal reflux disease without esophagitis: Secondary | ICD-10-CM

## 2019-12-15 DIAGNOSIS — R1013 Epigastric pain: Secondary | ICD-10-CM

## 2019-12-15 DIAGNOSIS — E039 Hypothyroidism, unspecified: Secondary | ICD-10-CM

## 2019-12-15 DIAGNOSIS — J449 Chronic obstructive pulmonary disease, unspecified: Secondary | ICD-10-CM

## 2019-12-15 DIAGNOSIS — R197 Diarrhea, unspecified: Secondary | ICD-10-CM

## 2019-12-15 LAB — COMPREHENSIVE METABOLIC PANEL
ALT: 69 U/L — ABNORMAL HIGH (ref 0–44)
AST: 75 U/L — ABNORMAL HIGH (ref 15–41)
Albumin: 3.6 g/dL (ref 3.5–5.0)
Alkaline Phosphatase: 48 U/L (ref 38–126)
Anion gap: 10 (ref 5–15)
BUN: 13 mg/dL (ref 8–23)
CO2: 24 mmol/L (ref 22–32)
Calcium: 8 mg/dL — ABNORMAL LOW (ref 8.9–10.3)
Chloride: 99 mmol/L (ref 98–111)
Creatinine, Ser: 0.9 mg/dL (ref 0.61–1.24)
GFR calc Af Amer: >60 mL/min (ref >60)
GFR calc non Af Amer: >60 mL/min (ref >60)
Glucose, Bld: 95 mg/dL (ref 70–99)
Potassium: 3.6 mmol/L (ref 3.5–5.1)
Sodium: 133 mmol/L — ABNORMAL LOW (ref 135–145)
Total Bilirubin: 1.3 mg/dL — ABNORMAL HIGH (ref 0.3–1.2)
Total Protein: 6.4 g/dL — ABNORMAL LOW (ref 6.5–8.1)

## 2019-12-15 LAB — CREATININE, SERUM
Creatinine, Ser: 0.9 mg/dL (ref 0.61–1.24)
GFR calc Af Amer: >60 mL/min (ref >60)
GFR calc non Af Amer: >60 mL/min (ref >60)

## 2019-12-15 LAB — URINE CULTURE: Culture: NO GROWTH

## 2019-12-15 LAB — CBC
HCT: 36.9 % — ABNORMAL LOW (ref 39.0–52.0)
HCT: 39 % (ref 39.0–52.0)
Hemoglobin: 13.3 g/dL (ref 13.0–17.0)
Hemoglobin: 13.7 g/dL (ref 13.0–17.0)
MCH: 36.2 pg — ABNORMAL HIGH (ref 26.0–34.0)
MCH: 35.7 pg — ABNORMAL HIGH (ref 26.0–34.0)
MCHC: 36 g/dL (ref 30.0–36.0)
MCHC: 35.1 g/dL (ref 30.0–36.0)
MCV: 100.5 fL — ABNORMAL HIGH (ref 80.0–100.0)
MCV: 101.6 fL — ABNORMAL HIGH (ref 80.0–100.0)
Platelets: 103 10*3/uL — ABNORMAL LOW (ref 150–400)
Platelets: 110 10*3/uL — ABNORMAL LOW (ref 150–400)
RBC: 3.67 MIL/uL — ABNORMAL LOW (ref 4.22–5.81)
RBC: 3.84 MIL/uL — ABNORMAL LOW (ref 4.22–5.81)
RDW: 13.2 % (ref 11.5–15.5)
RDW: 13.4 % (ref 11.5–15.5)
WBC: 9.2 10*3/uL (ref 4.0–10.5)
WBC: 10.1 10*3/uL (ref 4.0–10.5)
nRBC: 0 % (ref 0.0–0.2)
nRBC: 0 % (ref 0.0–0.2)

## 2019-12-15 LAB — C DIFFICILE QUICK SCREEN W PCR REFLEX
C Diff antigen: NEGATIVE
C Diff interpretation: NOT DETECTED
C Diff toxin: NEGATIVE

## 2019-12-15 LAB — SARS CORONAVIRUS 2 BY RT PCR (HOSPITAL ORDER, PERFORMED IN ~~LOC~~ HOSPITAL LAB): SARS Coronavirus 2: NEGATIVE

## 2019-12-15 LAB — PROCALCITONIN: Procalcitonin: 8.42 ng/mL

## 2019-12-15 LAB — TSH: TSH: 2.559 u[IU]/mL (ref 0.350–4.500)

## 2019-12-15 LAB — HEMOGLOBIN A1C
Hgb A1c MFr Bld: 5.3 % (ref 4.8–5.6)
Mean Plasma Glucose: 105.41 mg/dL

## 2019-12-15 LAB — STREP PNEUMONIAE URINARY ANTIGEN: Strep Pneumo Urinary Antigen: NEGATIVE

## 2019-12-15 MED ORDER — IPRATROPIUM-ALBUTEROL 0.5-2.5 (3) MG/3ML IN SOLN
3.0000 mL | Freq: Four times a day (QID) | RESPIRATORY_TRACT | Status: DC
  Administered 2019-12-15: 3 mL via RESPIRATORY_TRACT

## 2019-12-15 MED ORDER — SODIUM CHLORIDE 0.9 % IV SOLN
2.0000 g | INTRAVENOUS | Status: DC
  Administered 2019-12-15 – 2019-12-16 (×2): 2 g via INTRAVENOUS
  Filled 2019-12-15: qty 2
  Filled 2019-12-15: qty 20

## 2019-12-15 MED ORDER — POLYETHYLENE GLYCOL 3350 17 G PO PACK: 17.0000 g | PACK | Freq: Every day | ORAL | Status: DC | PRN

## 2019-12-15 MED ORDER — ACETAMINOPHEN 650 MG RE SUPP: 650.0000 mg | Freq: Four times a day (QID) | RECTAL | Status: DC | PRN

## 2019-12-15 MED ORDER — LOSARTAN POTASSIUM 25 MG PO TABS
25.0000 mg | ORAL_TABLET | Freq: Every day | ORAL | Status: DC
  Administered 2019-12-16: 25 mg via ORAL
  Filled 2019-12-15 (×2): qty 1

## 2019-12-15 MED ORDER — SODIUM CHLORIDE 0.9 % IV SOLN
500.0000 mg | INTRAVENOUS | Status: DC
  Administered 2019-12-15 – 2019-12-16 (×2): 500 mg via INTRAVENOUS
  Filled 2019-12-15 (×2): qty 500

## 2019-12-15 MED ORDER — IPRATROPIUM-ALBUTEROL 0.5-2.5 (3) MG/3ML IN SOLN
3.0000 mL | RESPIRATORY_TRACT | Status: DC | PRN
  Administered 2019-12-15: 3 mL via RESPIRATORY_TRACT
  Filled 2019-12-15: qty 3

## 2019-12-15 MED ORDER — ENOXAPARIN SODIUM 40 MG/0.4ML ~~LOC~~ SOLN
40.0000 mg | Freq: Every day | SUBCUTANEOUS | Status: DC
  Administered 2019-12-15 (×2): 40 mg via SUBCUTANEOUS
  Filled 2019-12-15 (×2): qty 0.4

## 2019-12-15 MED ORDER — SENNOSIDES-DOCUSATE SODIUM 8.6-50 MG PO TABS: 2.0000 | ORAL_TABLET | Freq: Every evening | ORAL | Status: DC | PRN

## 2019-12-15 MED ORDER — NITROGLYCERIN 0.4 MG SL SUBL: 0.4000 mg | SUBLINGUAL_TABLET | SUBLINGUAL | Status: DC | PRN

## 2019-12-15 MED ORDER — FOLIC ACID 1 MG PO TABS
1.0000 mg | ORAL_TABLET | Freq: Every day | ORAL | Status: DC
  Administered 2019-12-15 – 2019-12-16 (×2): 1 mg via ORAL
  Filled 2019-12-15 (×2): qty 1

## 2019-12-15 MED ORDER — IPRATROPIUM-ALBUTEROL 0.5-2.5 (3) MG/3ML IN SOLN
3.0000 mL | Freq: Three times a day (TID) | RESPIRATORY_TRACT | Status: DC
  Administered 2019-12-15 (×2): 3 mL via RESPIRATORY_TRACT
  Filled 2019-12-15 (×3): qty 3

## 2019-12-15 MED ORDER — ACETAMINOPHEN 325 MG PO TABS: 650.0000 mg | ORAL_TABLET | Freq: Four times a day (QID) | ORAL | Status: DC | PRN

## 2019-12-15 MED ORDER — SIMVASTATIN 20 MG PO TABS
40.0000 mg | ORAL_TABLET | Freq: Every day | ORAL | Status: DC
  Administered 2019-12-15 (×2): 40 mg via ORAL
  Filled 2019-12-15 (×2): qty 2

## 2019-12-15 MED ORDER — LEVOTHYROXINE SODIUM 25 MCG PO TABS
25.0000 ug | ORAL_TABLET | Freq: Every day | ORAL | Status: DC
  Administered 2019-12-15 – 2019-12-16 (×2): 25 ug via ORAL
  Filled 2019-12-15 (×2): qty 1

## 2019-12-15 MED ORDER — ASPIRIN EC 81 MG PO TBEC
81.0000 mg | DELAYED_RELEASE_TABLET | Freq: Every day | ORAL | Status: DC
  Administered 2019-12-15 – 2019-12-16 (×2): 81 mg via ORAL
  Filled 2019-12-15 (×2): qty 1

## 2019-12-15 MED ORDER — PANTOPRAZOLE SODIUM 40 MG PO TBEC
40.0000 mg | DELAYED_RELEASE_TABLET | Freq: Every day | ORAL | Status: DC
  Administered 2019-12-15 – 2019-12-16 (×2): 40 mg via ORAL
  Filled 2019-12-15 (×2): qty 1

## 2019-12-15 MED ORDER — METOPROLOL TARTRATE 12.5 MG HALF TABLET
12.5000 mg | ORAL_TABLET | Freq: Two times a day (BID) | ORAL | Status: DC
  Administered 2019-12-15 – 2019-12-16 (×3): 12.5 mg via ORAL
  Filled 2019-12-15 (×4): qty 1

## 2019-12-15 MED ORDER — SODIUM CHLORIDE 0.45 % IV SOLN: INTRAVENOUS | Status: DC

## 2019-12-15 MED ORDER — PROMETHAZINE HCL 25 MG PO TABS: 12.5000 mg | ORAL_TABLET | Freq: Four times a day (QID) | ORAL | Status: DC | PRN

## 2019-12-15 MED ORDER — TRAZODONE HCL 50 MG PO TABS
150.0000 mg | ORAL_TABLET | Freq: Every evening | ORAL | Status: DC | PRN
  Administered 2019-12-15: 150 mg via ORAL
  Filled 2019-12-15: qty 2

## 2019-12-15 MED ORDER — SENNOSIDES-DOCUSATE SODIUM 8.6-50 MG PO TABS: 1.0000 | ORAL_TABLET | Freq: Every evening | ORAL | Status: DC | PRN

## 2019-12-15 NOTE — ED Notes (Signed)
ED TO INPATIENT HANDOFF REPORT  ED Nurse Name and Phone #: [phone]  S Name/Age/Gender Joshua Mcdaniel 79 y.o. male Room/Bed: WA19/WA19  Code Status   Code Status: DNR  Home/SNF/Other Home Patient oriented to: self, place, time and situation Is this baseline? Yes   Triage Complete: Triage complete  Chief Complaint Sepsis (HCC) [A41.9]  Triage Note Per EMS, pt from home complains of weakness, headache, increased urgency & frequent urination x 4 days. Pt also had n/v/d. Fever 103 w/ EMS, 1g tylenol and 500cc NS.    Allergies Allergies  Allergen Reactions  . Lisinopril Cough  . Penicillins Diarrhea    Did it involve swelling of the face/tongue/throat, SOB, or low BP? N Did it involve sudden or severe rash/hives, skin peeling, or any reaction on the inside of your mouth or nose? No but caused nausea, vomiting and diarrhea Did you need to seek medical attention at a hospital or doctor's office? Unknown When did it last happen?Several Years Ago  If all above answers are "NO", may proceed with cephalosporin use.      Level of Care/Admitting Diagnosis ED Disposition    ED Disposition Condition Comment   Admit  Hospital Area: [facility] [[id number]]  Level of Care: Med-Surg [16]  May admit patient to [facility] or [facility] if equivalent level of care is available:: Yes  Covid Evaluation: Symptomatic Person Under Investigation (PUI)  Admission Type: Urgent [2]  Diagnosis: Sepsis [facility]) [[id number]]  Admitting Physician: Samara Snide  Attending Physician: Reva Bores [[id number]]  Estimated length of stay: past midnight tomorrow  Certification:: I certify this patient will need inpatient services for at least 2 midnights       B Medical/Surgery History Past Medical History:  Diagnosis Date  . Myocardial infarct [facility])    Past Surgical History:  Procedure Laterality Date  . BACK SURGERY    . CARDIAC SURGERY       A IV  Location/Drains/Wounds Patient Lines/Drains/Airways Status    Active Line/Drains/Airways    Name Placement date Placement time Site Days   Peripheral IV 12/14/19 Left Antecubital 12/14/19  --  Antecubital  1   Peripheral IV 12/14/19 Right Antecubital 12/14/19  1736  Antecubital  1          Intake/Output Last 24 hours  Intake/Output Summary (Last 24 hours) at 12/15/2019 1238 Last data filed at 12/15/2019 1236 Gross per 24 hour  Intake 6198.35 ml  Output --  Net 6198.35 ml    Labs/Imaging Results for orders placed or performed during the hospital encounter of 12/14/19 (from the past 48 hour(s))  Lactic acid, plasma     Status: Abnormal   Collection Time: 12/14/19  3:26 PM  Result Value Ref Range   Lactic Acid, Venous 2.4 (HH) 0.5 - 1.9 mmol/L    Comment: CRITICAL RESULT CALLED TO, READ BACK BY AND VERIFIED WITH: Joylene Draft [id number]  BY V.WILKINS Performed at [facility], [address] [place], [place] [address]   Comprehensive metabolic panel     Status: Abnormal   Collection Time: 12/14/19  3:26 PM  Result Value Ref Range   Sodium 131 (L) 135 - 145 mmol/L   Potassium 3.7 3.5 - 5.1 mmol/L   Chloride 94 (L) 98 - 111 mmol/L   CO2 26 22 - 32 mmol/L   Glucose, Bld 146 (H) 70 - 99 mg/dL    Comment: Glucose reference range applies only to samples taken after fasting for  at least 8 hours.   BUN 18 8 - 23 mg/dL   Creatinine, Ser 1.321.19 0.61 - 1.24 mg/dL   Calcium 8.3 (L) 8.9 - 10.3 mg/dL   Total Protein 6.5 6.5 - 8.1 g/dL   Albumin 3.6 3.5 - 5.0 g/dL   AST 61 (H) 15 - 41 U/L   ALT 58 (H) 0 - 44 U/L   Alkaline Phosphatase 46 38 - 126 U/L   Total Bilirubin 1.4 (H) 0.3 - 1.2 mg/dL   GFR calc non Af Amer 57 (L) >60 mL/min   GFR calc Af Amer >60 >60 mL/min   Anion gap 11 5 - 15    Comment: Performed at [facility], [address] [place], [place] [address]  CBC     Status: Abnormal   Collection Time: 12/14/19  3:26 PM  Result Value Ref  Range   WBC 10.2 4.0 - 10.5 K/uL   RBC 3.91 (L) 4.22 - 5.81 MIL/uL   Hemoglobin 14.4 13.0 - 17.0 g/dL   HCT 27.239.8 39 - 52 %   MCV 101.8 (H) 80.0 - 100.0 fL   MCH 36.8 (H) 26.0 - 34.0 pg   MCHC 36.2 (H) 30.0 - 36.0 g/dL   RDW 53.613.2 64.411.5 - 03.415.5 %   Platelets 125 (L) 150 - 400 K/uL    Comment: Immature Platelet Fraction may be clinically indicated, consider ordering this additional test [id number]    nRBC 0.0 0.0 - 0.2 %    Comment: Performed at [facility], [address] [place], [place] [address]  Hemoglobin A1c     Status: None   Collection Time: 12/14/19  3:26 PM  Result Value Ref Range   Hgb A1c MFr Bld 5.3 4.8 - 5.6 %    Comment: (NOTE) Pre diabetes:          5.7%-6.4%  Diabetes:              >6.4%  Glycemic control for   <7.0% adults with diabetes    Mean Plasma Glucose 105.41 mg/dL    Comment: Performed at [facility], [address] [place], [place] [address]  Urinalysis, Routine w reflex microscopic     Status: None   Collection Time: 12/14/19  5:04 PM  Result Value Ref Range   Color, Urine YELLOW YELLOW   APPearance CLEAR CLEAR   Specific Gravity, Urine 1.018 1.005 - 1.030   pH 5.0 5.0 - 8.0   Glucose, UA NEGATIVE NEGATIVE mg/dL   Hgb urine dipstick NEGATIVE NEGATIVE   Bilirubin Urine NEGATIVE NEGATIVE   Ketones, ur NEGATIVE NEGATIVE mg/dL   Protein, ur NEGATIVE NEGATIVE mg/dL   Nitrite NEGATIVE NEGATIVE   Leukocytes,Ua NEGATIVE NEGATIVE    Comment: Performed at [facility], [address] [place], [place] [address]  Strep pneumoniae urinary antigen     Status: None   Collection Time: 12/14/19  5:04 PM  Result Value Ref Range   Strep Pneumo Urinary Antigen NEGATIVE NEGATIVE    Comment:        Infection due to S. pneumoniae cannot be absolutely ruled out since the antigen present may be below the detection limit of the test. Performed at [facility], [address] [place], [place] [address]    Lactic acid, plasma     Status: None   Collection Time: 12/14/19  5:13 PM  Result Value Ref Range   Lactic Acid, Venous 0.9 0.5 - 1.9 mmol/L  Comment: Performed at [facility], [address], [place], [place] [address]  Blood culture (routine x 2)     Status: None (Preliminary result)   Collection Time: 12/14/19  5:14 PM   Specimen: BLOOD  Result Value Ref Range   Specimen Description      BLOOD RIGHT HAND Performed at [facility], [address], [place], [place] [address]    Special Requests      BOTTLES DRAWN AEROBIC AND ANAEROBIC Blood Culture adequate volume Performed at [facility], [address], [place], [place] [address]    Culture      NO GROWTH < 12 HOURS Performed at [facility], [address] [place], [place] [address]    Report Status PENDING   Blood culture (routine x 2)     Status: None (Preliminary result)   Collection Time: 12/14/19  5:19 PM   Specimen: BLOOD  Result Value Ref Range   Specimen Description      BLOOD RIGHT ANTECUBITAL Performed at [facility], [address] [place], [place] [address]    Special Requests      BOTTLES DRAWN AEROBIC AND ANAEROBIC Blood Culture results may not be optimal due to an excessive volume of blood received in culture bottles Performed at [facility], [address] [place], [place] [address]    Culture      NO GROWTH < 12 HOURS Performed at [facility], [address], [place], [place] [address]    Report Status PENDING   SARS Coronavirus 2 by RT PCR (hospital order, performed in [facility] hospital lab) Nasopharyngeal Nasopharyngeal Swab     Status: None   Collection Time: 12/14/19 10:28 PM   Specimen: Nasopharyngeal Swab  Result Value Ref Range   SARS Coronavirus 2 NEGATIVE NEGATIVE    Comment: (NOTE) SARS-CoV-2 target nucleic acids are NOT DETECTED.  The SARS-CoV-2 RNA is generally  detectable in upper and lower respiratory specimens during the acute phase of infection. The lowest concentration of SARS-CoV-2 viral copies this assay can detect is 250 copies / mL. A negative result does not preclude SARS-CoV-2 infection and should not be used as the sole basis for treatment or other patient management decisions.  A negative result may occur with improper specimen collection / handling, submission of specimen other than nasopharyngeal swab, presence of viral mutation(s) within the areas targeted by this assay, and inadequate number of viral copies (<250 copies / mL). A negative result must be combined with clinical observations, patient history, and epidemiological information.  Fact Sheet for Patients:   BoilerBrush.com.cy  Fact Sheet for Healthcare Providers: https://pope.com/  This test is not yet approved or  cleared by the [place] FDA and has been authorized for detection and/or diagnosis of SARS-CoV-2 by FDA under an Emergency Use Authorization (EUA).  This EUA will remain in effect (meaning this test can be used) for the duration of the COVID-19 declaration under Section 564(b)(1) of the Act, 21 U.S.C. section 360bbb-3(b)(1), unless the authorization is terminated or revoked sooner.  Performed at [facility], [address] [place], [place] [address]   CBC     Status: Abnormal   Collection Time: 12/15/19 12:01 AM  Result Value Ref Range   WBC 9.2 4.0 - 10.5 K/uL   RBC 3.67 (L) 4.22 - 5.81 MIL/uL   Hemoglobin 13.3 13.0 - 17.0 g/dL   HCT 35.4 (L) 39 - 52 %  MCV 100.5 (H) 80.0 - 100.0 fL   MCH 36.2 (H) 26.0 - 34.0 pg   MCHC 36.0 30.0 - 36.0 g/dL   RDW 16.1 09.6 - 04.5 %   Platelets 103 (L) 150 - 400 K/uL    Comment: SPECIMEN CHECKED FOR CLOTS Immature Platelet Fraction may be clinically indicated, consider ordering this additional test [id number] REPEATED TO VERIFY    nRBC  0.0 0.0 - 0.2 %    Comment: Performed at [facility], [address], [place], [place] [address]  Creatinine, serum     Status: None   Collection Time: 12/15/19 12:01 AM  Result Value Ref Range   Creatinine, Ser 0.90 0.61 - 1.24 mg/dL   GFR calc non Af Amer >60 >60 mL/min   GFR calc Af Amer >60 >60 mL/min    Comment: Performed at [facility], [address] [place], [place] [address]  TSH     Status: None   Collection Time: 12/15/19 12:55 AM  Result Value Ref Range   TSH 2.559 0.350 - 4.500 uIU/mL    Comment: Performed by a 3rd Generation assay with a functional sensitivity of <=0.01 uIU/mL. Performed at [facility], [address] [place], [place] [address]   C Difficile Quick Screen w PCR reflex     Status: None   Collection Time: 12/15/19  6:08 AM   Specimen: STOOL  Result Value Ref Range   C Diff antigen NEGATIVE NEGATIVE   C Diff toxin NEGATIVE NEGATIVE   C Diff interpretation No C. difficile detected.     Comment: VALID Performed at [facility], [address], [place], [place] [address]   CBC     Status: Abnormal   Collection Time: 12/15/19  7:00 AM  Result Value Ref Range   WBC 10.1 4.0 - 10.5 K/uL   RBC 3.84 (L) 4.22 - 5.81 MIL/uL   Hemoglobin 13.7 13.0 - 17.0 g/dL   HCT 29.5 39 - 52 %   MCV 101.6 (H) 80.0 - 100.0 fL   MCH 35.7 (H) 26.0 - 34.0 pg   MCHC 35.1 30.0 - 36.0 g/dL   RDW 28.4 13.2 - 44.0 %   Platelets 110 (L) 150 - 400 K/uL    Comment: Immature Platelet Fraction may be clinically indicated, consider ordering this additional test [id number] PLATELET COUNT CONFIRMED BY SMEAR REPEATED TO VERIFY    nRBC 0.0 0.0 - 0.2 %    Comment: Performed at [facility], [address] [place], [place] [address]  Comprehensive metabolic panel     Status: Abnormal   Collection Time: 12/15/19  7:00 AM  Result Value Ref Range   Sodium 133 (L) 135 - 145 mmol/L    Potassium 3.6 3.5 - 5.1 mmol/L   Chloride 99 98 - 111 mmol/L   CO2 24 22 - 32 mmol/L   Glucose, Bld 95 70 - 99 mg/dL    Comment: Glucose reference range applies only to samples taken after fasting for at least 8 hours.   BUN 13 8 - 23 mg/dL   Creatinine, Ser 4.74 0.61 - 1.24 mg/dL   Calcium 8.0 (L) 8.9 - 10.3 mg/dL   Total Protein 6.4 (L) 6.5 - 8.1 g/dL   Albumin 3.6 3.5 - 5.0 g/dL   AST 75 (H) 15 - 41 U/L   ALT 69 (H) 0 - 44 U/L   Alkaline Phosphatase 48 38 - 126 U/L   Total Bilirubin 1.3 (  H) 0.3 - 1.2 mg/dL   GFR calc non Af Amer >60 >60 mL/min   GFR calc Af Amer >60 >60 mL/min   Anion gap 10 5 - 15    Comment: Performed at [facility], [address], [place], [place] [address]  Procalcitonin - Baseline     Status: None   Collection Time: 12/15/19  7:38 AM  Result Value Ref Range   Procalcitonin 8.42 ng/mL    Comment:        Interpretation: PCT > 2 ng/mL: Systemic infection (sepsis) is likely, unless other causes are known. (NOTE)       Sepsis PCT Algorithm           Lower Respiratory Tract                                      Infection PCT Algorithm    ----------------------------     ----------------------------         PCT < 0.25 ng/mL                PCT < 0.10 ng/mL          Strongly encourage             Strongly discourage   discontinuation of antibiotics    initiation of antibiotics    ----------------------------     -----------------------------       PCT 0.25 - 0.50 ng/mL            PCT 0.10 - 0.25 ng/mL               OR       >80% decrease in PCT            Discourage initiation of                                            antibiotics      Encourage discontinuation           of antibiotics    ----------------------------     -----------------------------         PCT >= 0.50 ng/mL              PCT 0.26 - 0.50 ng/mL               AND       <80% decrease in PCT              Encourage initiation of                                              antibiotics       Encourage continuation           of antibiotics    ----------------------------     -----------------------------        PCT >= 0.50 ng/mL                  PCT > 0.50 ng/mL               AND         increase in PCT  Strongly encourage                                      initiation of antibiotics    Strongly encourage escalation           of antibiotics                                     -----------------------------                                           PCT <= 0.25 ng/mL                                                 OR                                        > 80% decrease in PCT                                      Discontinue / Do not initiate                                             antibiotics  Performed at [facility], [address] [place], [place] [address]    CT Abdomen Pelvis W Contrast  Result Date: 12/14/2019 CLINICAL DATA:  Fever, weakness, nausea, vomiting, and diarrhea. EXAM: CT ABDOMEN AND PELVIS WITH CONTRAST TECHNIQUE: Multidetector CT imaging of the abdomen and pelvis was performed using the standard protocol following bolus administration of intravenous contrast. CONTRAST:  OMNIPAQUE IOHEXOL 300 MG/ML  SOLN COMPARISON:  CT abdomen pelvis dated April 10, 2018. FINDINGS: Lower chest: No acute abnormality. Hepatobiliary: No focal liver abnormality is seen. Mild periportal edema. Small gallstones. No gallbladder wall thickening or biliary dilatation. Pancreas: Unremarkable. No pancreatic ductal dilatation or surrounding inflammatory changes. Spleen: Normal in size without focal abnormality. Adrenals/Urinary Tract: Adrenal glands are unremarkable. Unchanged small left renal cysts. Chronic scarring in both upper poles again noted. No renal calculi or hydronephrosis. Bladder is unremarkable for the degree of distention. Stomach/Bowel: Stomach is within normal limits. Diminutive or absent appendix. No  evidence of bowel wall thickening, distention, or inflammatory changes. Mild left-sided colonic diverticulosis. Vascular/Lymphatic: Aortic atherosclerosis. No enlarged abdominal or pelvic lymph nodes. Reproductive: Prostate is unremarkable. Other: Unchanged small fat containing left greater than right inguinal hernias. No free fluid or pneumoperitoneum. Musculoskeletal: No acute or significant osseous findings. Prior lumbar fusion. Chronic L2 compression deformity. IMPRESSION: 1. No acute intra-abdominal process. 2. Unchanged cholelithiasis. 3. Aortic Atherosclerosis (ICD10-I70.0). Electronically Signed   By: Obie Dredge M.D.   On: 12/14/2019 20:20   US Abdomen Limited  Result Date: 12/14/2019 CLINICAL DATA:  Fever EXAM: ULTRASOUND ABDOMEN LIMITED RIGHT UPPER QUADRANT COMPARISON:  CT today FINDINGS: Gallbladder:  Small stones seen within the gallbladder. No wall thickening or sonographic Murphy's sign. Small amount of pericholecystic fluid suspected. Common bile duct: Diameter: Normal caliber, 4 mm Liver: No focal lesion identified. Within normal limits in parenchymal echogenicity. Portal vein is patent on color Doppler imaging with normal direction of blood flow towards the liver. Other: None. IMPRESSION: Cholelithiasis. There is no wall thickening or sonographic Murphy's sign. There appears to be a small amount of pericholecystic fluid. Recommend clinical correlation to completely exclude acute cholecystitis. Electronically Signed   By: Charlett Nose M.D.   On: 12/14/2019 21:47   DG Chest Port 1 View  Result Date: 12/14/2019 CLINICAL DATA:  Fever.  Weakness and headache.  Urinary urgency. EXAM: PORTABLE CHEST 1 VIEW COMPARISON:  Radiograph 12/13/2018 FINDINGS: Upper normal heart size. Coronary stent is visualized. Normal mediastinal contours. Streaky opacities at the right lung base. No pulmonary edema. No pleural fluid or pneumothorax. No acute osseous abnormalities are seen. IMPRESSION: 1. Streaky  right lung base opacities, favor atelectasis, however atypical viral pneumonia could have a similar appearance. 2. Borderline cardiomegaly.  Coronary stent. Electronically Signed   By: Narda Rutherford M.D.   On: 12/14/2019 19:58    Pending Labs Unresulted Labs (From admission, onward) Comment          Start     Ordered   12/21/19 0500  Creatinine, serum  (enoxaparin (LOVENOX)    CrCl >/= 30 ml/min)  Weekly,   R     Comments: while on enoxaparin therapy    12/15/19 0000   12/16/19 0500  Comprehensive metabolic panel  Daily,   R     Question:  Specimen collection method  Answer:  Lab=Lab collect   12/15/19 0834   12/16/19 0500  CBC  Daily,   R     Question:  Specimen collection method  Answer:  Lab=Lab collect   12/15/19 0834   12/16/19 0500  Magnesium  Daily,   R     Question:  Specimen collection method  Answer:  Lab=Lab collect   12/15/19 0834   12/15/19 0836  Gastrointestinal Panel by PCR , Stool  (Gastrointestinal Panel by PCR, Stool                                                                                                                                                     *Does Not include CLOSTRIDIUM DIFFICILE testing.**If CDIFF testing is needed, select the C Difficile Quick Screen w PCR reflex order below)  Once,   STAT       Question:  Patient immune status  Answer:  Normal   12/15/19 0835   12/15/19 0001  Legionella Pneumophila Serogp 1 Ur Ag  Once,   STAT        12/15/19 0000   12/14/19 1526  Urine Culture  Once,  STAT        12/14/19 1526          Vitals/Pain Today's Vitals   12/15/19 0900 12/15/19 0930 12/15/19 1032 12/15/19 1222  BP: (!) 107/49 (!) 109/55 115/60 113/69  Pulse: 62 65 (!) 59 67  Resp: (!) 32 (!) 29 (!) 27 17  Temp:    99 F (37.2 C)  TempSrc:    Oral  SpO2: 97% 95% 97% 98%  Weight:      Height:      PainSc:        Isolation Precautions Enteric precautions (UV disinfection)  Medications Medications  sodium chloride (PF) 0.9 %  injection (has no administration in time range)  nitroGLYCERIN (NITROSTAT) SL tablet 0.4 mg (has no administration in time range)  simvastatin (ZOCOR) tablet 40 mg (40 mg Oral Given 12/15/19 0117)  traZODone (DESYREL) tablet 150 mg (150 mg Oral Given 12/15/19 0124)  levothyroxine (SYNTHROID) tablet 25 mcg (25 mcg Oral Given 12/15/19 0941)  pantoprazole (PROTONIX) EC tablet 40 mg (40 mg Oral Given 12/15/19 0942)  folic acid (FOLVITE) tablet 1 mg (1 mg Oral Given 12/15/19 0942)  losartan (COZAAR) tablet 25 mg (25 mg Oral Not Given 12/15/19 0943)  metoprolol tartrate (LOPRESSOR) tablet 12.5 mg (0 mg Oral Hold 12/15/19 0944)  enoxaparin (LOVENOX) injection 40 mg (40 mg Subcutaneous Given 12/15/19 0526)  0.45 % sodium chloride infusion ( Intravenous Rate/Dose Verify 12/15/19 1236)  acetaminophen (TYLENOL) tablet 650 mg (has no administration in time range)    Or  acetaminophen (TYLENOL) suppository 650 mg (has no administration in time range)  promethazine (PHENERGAN) tablet 12.5 mg (has no administration in time range)  aspirin EC tablet 81 mg (81 mg Oral Given 12/15/19 0942)  cefTRIAXone (ROCEPHIN) 2 g in sodium chloride 0.9 % 100 mL IVPB (0 g Intravenous Stopped 12/15/19 0227)  azithromycin (ZITHROMAX) 500 mg in sodium chloride 0.9 % 250 mL IVPB (0 mg Intravenous Stopped 12/15/19 0227)  ipratropium-albuterol (DUONEB) 0.5-2.5 (3) MG/3ML nebulizer solution 3 mL (3 mLs Nebulization Given 12/15/19 0910)  polyethylene glycol (MIRALAX / GLYCOLAX) packet 17 g (has no administration in time range)  senna-docusate (Senokot-S) tablet 2 tablet (has no administration in time range)  ipratropium-albuterol (DUONEB) 0.5-2.5 (3) MG/3ML nebulizer solution 3 mL (has no administration in time range)  sodium chloride 0.9 % bolus 1,000 mL (0 mLs Intravenous Stopped 12/14/19 1712)  sodium chloride 0.9 % bolus 1,000 mL (0 mLs Intravenous Stopped 12/14/19 1828)  lactated ringers bolus 1,000 mL (0 mLs Intravenous Stopped 12/14/19  2041)    And  lactated ringers bolus 1,000 mL (0 mLs Intravenous Stopped 12/14/19 2041)    And  lactated ringers bolus 500 mL (0 mLs Intravenous Stopped 12/14/19 2041)  iohexol (OMNIPAQUE) 300 MG/ML solution 100 mL (100 mLs Intravenous Contrast Given 12/14/19 1953)  ceFEPIme (MAXIPIME) 2 g in sodium chloride 0.9 % 100 mL IVPB (0 g Intravenous Stopped 12/14/19 2313)  vancomycin (VANCOCIN) IVPB 1000 mg/200 mL premix (0 mg Intravenous Stopped 12/14/19 2353)  metroNIDAZOLE (FLAGYL) IVPB 500 mg (0 mg Intravenous Stopped 12/14/19 2354)  ondansetron (ZOFRAN) injection 4 mg (4 mg Intravenous Given 12/14/19 2309)    Mobility walks with device Moderate fall risk   Focused Assessments    R Recommendations: See Admitting Provider Note  Report given to: Lacinda Axon  Additional Notes:

## 2019-12-15 NOTE — ED Notes (Addendum)
Pt purple man was given at same time as another patient. Had to call report on her and this patient at same time basically. Pt wanted to eat lunch before go, gave lunch tray as called report. Waiting for patient to finish

## 2019-12-15 NOTE — Progress Notes (Signed)
PROGRESS NOTE    HUE FRICK  NFA:[id number] DOB: Nov 16, 1940 DOA: 12/14/2019 PCP: Francesca Oman, DO   Brief Narrative:  79 year old with past medical history of HTN, hypothyroidism, CAD presents with 5-day of diarrhea, nonspecific abdominal pain and diarrhea ongoing for several days.  Most of her symptoms are nonspecific but having at least 10+ watery bowel movements daily.   Assessment & Plan:   Active Problems:   Atherosclerotic heart disease of native coronary artery without angina pectoris   COPD (chronic obstructive pulmonary disease) (HCC)   Essential hypertension   Gastro-esophageal reflux disease without esophagitis   H/O heart artery stent   Hyperlipidemia   Hypothyroidism   Spinal stenosis   Sepsis ([place])  Sepsis, POA- unknown exact etiology.  Nausea, diarrhea and vomiting, nonspecific Cholelithiasis without acute cholecystitis Right base opacity -Sepsis physiology improving.  Empirically patient is on Rocephin and azithromycin -Very mild transaminitis -Bronchodilators, I-S, flutter, check procalcitonin -C. difficile-negative, GI panel-pending -CT abdomen pelvis-negative -Right upper quadrant ultrasound-cholelithiasis but no obvious evidence of acute cholecystitis.  If symptoms fail to improve, require HIDA scan versus MRCP  Essential hypertension -Hold losartan and metoprolol  Coronary artery disease status post PCI -Currently chest pain-free on aspirin and statin.  Continue metoprolol, losartan  GERD -Protonix 40 mg daily  Hyperlipidemia -Zocor 40 mg daily  Hypothyroidism -Synthroid 25 mcg daily  Pharmacy to complete.  DVT prophylaxis: Subcu Lovenox Code Status: DNR Family Communication:    Status is: Inpatient  Remains inpatient appropriate because:Persistent severe electrolyte disturbances   Dispo: The patient is from: Home              Anticipated d/c is to: Home              Anticipated d/c date is: 2 days              Patient  currently is not medically stable to d/c.  Still having significant amount of diarrhea.  Unsafe for discharge, requires IV fluids.     Subjective: Still having abdominal discomfort and diarrhea.  Review of Systems Otherwise negative except as per HPI, including: General: Denies fever, chills, night sweats or unintended weight loss. Resp: Denies cough, wheezing, shortness of breath. Cardiac: Denies chest pain, palpitations, orthopnea, paroxysmal nocturnal dyspnea. GI: Denies constipation GU: Denies dysuria, frequency, hesitancy or incontinence MS: Denies muscle aches, joint pain or swelling Neuro: Denies headache, neurologic deficits (focal weakness, numbness, tingling), abnormal gait Psych: Denies anxiety, depression, SI/HI/AVH Skin: Denies new rashes or lesions ID: Denies sick contacts, exotic exposures, travel  Examination:  General exam: Appears calm and comfortable  Respiratory system: Clear to auscultation. Respiratory effort normal. Cardiovascular system: S1 & S2 heard, RRR. No JVD, murmurs, rubs, gallops or clicks. No pedal edema. Gastrointestinal system: Abdomen is nondistended, soft and nontender. No organomegaly or masses felt. Normal bowel sounds heard. Central nervous system: Alert and oriented. No focal neurological deficits. Extremities: Symmetric 5 x 5 power. Skin: No rashes, lesions or ulcers Psychiatry: Judgement and insight appear normal. Mood & affect appropriate.     Objective: Vitals:   12/15/19 0502 12/15/19 0523 12/15/19 0558 12/15/19 0628  BP:  105/66 (!) 111/54 101/71  Pulse: (!) 145 79 66 60  Resp: (!) 30 (!) 21 20 (!) 30  Temp:      TempSrc:      SpO2: 97% 97% 99% 100%  Weight:      Height:        Intake/Output Summary (Last 24 hours) at 12/15/2019  9798 Last data filed at 12/15/2019 0227 Gross per 24 hour  Intake 5350 ml  Output --  Net 5350 ml   Filed Weights   12/14/19 1724  Weight: 78.5 kg     Data Reviewed:   CBC: Recent Labs   Lab 12/14/19 1526 12/15/19 0001  WBC 10.2 9.2  HGB 14.4 13.3  HCT 39.8 36.9*  MCV 101.8* 100.5*  PLT 125* 103*   Basic Metabolic Panel: Recent Labs  Lab 12/14/19 1526 12/15/19 0001 12/15/19 0700  NA 131*  --  133*  K 3.7  --  3.6  CL 94*  --  99  CO2 26  --  24  GLUCOSE 146*  --  95  BUN 18  --  13  CREATININE 1.19 0.90 0.90  CALCIUM 8.3*  --  8.0*   GFR: Estimated Creatinine Clearance: 62.2 mL/min (by C-G formula based on SCr of 0.9 mg/dL). Liver Function Tests: Recent Labs  Lab 12/14/19 1526 12/15/19 0700  AST 61* 75*  ALT 58* 69*  ALKPHOS 46 48  BILITOT 1.4* 1.3*  PROT 6.5 6.4*  ALBUMIN 3.6 3.6   No results for input(s): LIPASE, AMYLASE in the last 168 hours. No results for input(s): AMMONIA in the last 168 hours. Coagulation Profile: No results for input(s): INR, PROTIME in the last 168 hours. Cardiac Enzymes: No results for input(s): CKTOTAL, CKMB, CKMBINDEX, TROPONINI in the last 168 hours. BNP (last 3 results) No results for input(s): PROBNP in the last 8760 hours. HbA1C: Recent Labs    12/14/19 1526  HGBA1C 5.3   CBG: No results for input(s): GLUCAP in the last 168 hours. Lipid Profile: No results for input(s): CHOL, HDL, LDLCALC, TRIG, CHOLHDL, LDLDIRECT in the last 72 hours. Thyroid Function Tests: Recent Labs    12/15/19 0055  TSH 2.559   Anemia Panel: No results for input(s): VITAMINB12, FOLATE, FERRITIN, TIBC, IRON, RETICCTPCT in the last 72 hours. Sepsis Labs: Recent Labs  Lab 12/14/19 1526 12/14/19 1713  LATICACIDVEN 2.4* 0.9    Recent Results (from the past 240 hour(s))  Blood culture (routine x 2)     Status: None (Preliminary result)   Collection Time: 12/14/19  5:14 PM   Specimen: BLOOD  Result Value Ref Range Status   Specimen Description   Final    BLOOD RIGHT HAND Performed at [facility], [address], [place], [place] [address]    Special Requests   Final    BOTTLES DRAWN AEROBIC AND  ANAEROBIC Blood Culture adequate volume Performed at [facility], [address], [place], [place] [address]    Culture   Final    NO GROWTH < 12 HOURS Performed at [facility], [address], [place], [place] [address]    Report Status PENDING  Incomplete  Blood culture (routine x 2)     Status: None (Preliminary result)   Collection Time: 12/14/19  5:19 PM   Specimen: BLOOD  Result Value Ref Range Status   Specimen Description   Final    BLOOD RIGHT ANTECUBITAL Performed at [facility], [address], [place], [place] [address]    Special Requests   Final    BOTTLES DRAWN AEROBIC AND ANAEROBIC Blood Culture results may not be optimal due to an excessive volume of blood received in culture bottles Performed at [facility], [address] [place], [place] [address]    Culture   Final    NO GROWTH <  12 HOURS Performed at [facility], [address] [place], [place] [address]    Report Status PENDING  Incomplete  SARS Coronavirus 2 by RT PCR (hospital order, performed in [facility] hospital lab) Nasopharyngeal Nasopharyngeal Swab     Status: None   Collection Time: 12/14/19 10:28 PM   Specimen: Nasopharyngeal Swab  Result Value Ref Range Status   SARS Coronavirus 2 NEGATIVE NEGATIVE Final    Comment: (NOTE) SARS-CoV-2 target nucleic acids are NOT DETECTED.  The SARS-CoV-2 RNA is generally detectable in upper and lower respiratory specimens during the acute phase of infection. The lowest concentration of SARS-CoV-2 viral copies this assay can detect is 250 copies / mL. A negative result does not preclude SARS-CoV-2 infection and should not be used as the sole basis for treatment or other patient management decisions.  A negative result may occur with improper specimen collection / handling, submission of specimen other than nasopharyngeal swab, presence of viral mutation(s) within the areas targeted  by this assay, and inadequate number of viral copies (<250 copies / mL). A negative result must be combined with clinical observations, patient history, and epidemiological information.  Fact Sheet for Patients:   BoilerBrush.com.cy  Fact Sheet for Healthcare Providers: https://pope.com/  This test is not yet approved or  cleared by the [place] FDA and has been authorized for detection and/or diagnosis of SARS-CoV-2 by FDA under an Emergency Use Authorization (EUA).  This EUA will remain in effect (meaning this test can be used) for the duration of the COVID-19 declaration under Section 564(b)(1) of the Act, 21 U.S.C. section 360bbb-3(b)(1), unless the authorization is terminated or revoked sooner.  Performed at [facility], [address], [place], [place] [address]          Radiology Studies: CT Abdomen Pelvis W Contrast  Result Date: 12/14/2019 CLINICAL DATA:  Fever, weakness, nausea, vomiting, and diarrhea. EXAM: CT ABDOMEN AND PELVIS WITH CONTRAST TECHNIQUE: Multidetector CT imaging of the abdomen and pelvis was performed using the standard protocol following bolus administration of intravenous contrast. CONTRAST:  OMNIPAQUE IOHEXOL 300 MG/ML  SOLN COMPARISON:  CT abdomen pelvis dated April 10, 2018. FINDINGS: Lower chest: No acute abnormality. Hepatobiliary: No focal liver abnormality is seen. Mild periportal edema. Small gallstones. No gallbladder wall thickening or biliary dilatation. Pancreas: Unremarkable. No pancreatic ductal dilatation or surrounding inflammatory changes. Spleen: Normal in size without focal abnormality. Adrenals/Urinary Tract: Adrenal glands are unremarkable. Unchanged small left renal cysts. Chronic scarring in both upper poles again noted. No renal calculi or hydronephrosis. Bladder is unremarkable for the degree of distention. Stomach/Bowel: Stomach is within normal limits.  Diminutive or absent appendix. No evidence of bowel wall thickening, distention, or inflammatory changes. Mild left-sided colonic diverticulosis. Vascular/Lymphatic: Aortic atherosclerosis. No enlarged abdominal or pelvic lymph nodes. Reproductive: Prostate is unremarkable. Other: Unchanged small fat containing left greater than right inguinal hernias. No free fluid or pneumoperitoneum. Musculoskeletal: No acute or significant osseous findings. Prior lumbar fusion. Chronic L2 compression deformity. IMPRESSION: 1. No acute intra-abdominal process. 2. Unchanged cholelithiasis. 3. Aortic Atherosclerosis (ICD10-I70.0). Electronically Signed   By: Obie Dredge M.D.   On: 12/14/2019 20:20   US Abdomen Limited  Result Date: 12/14/2019 CLINICAL DATA:  Fever EXAM: ULTRASOUND ABDOMEN LIMITED RIGHT UPPER QUADRANT COMPARISON:  CT today FINDINGS: Gallbladder: Small stones seen within the gallbladder. No wall thickening or sonographic Murphy's sign. Small amount of pericholecystic fluid suspected. Common bile duct: Diameter: Normal caliber, 4 mm Liver: No focal  lesion identified. Within normal limits in parenchymal echogenicity. Portal vein is patent on color Doppler imaging with normal direction of blood flow towards the liver. Other: None. IMPRESSION: Cholelithiasis. There is no wall thickening or sonographic Murphy's sign. There appears to be a small amount of pericholecystic fluid. Recommend clinical correlation to completely exclude acute cholecystitis. Electronically Signed   By: Charlett Nose M.D.   On: 12/14/2019 21:47   DG Chest Port 1 View  Result Date: 12/14/2019 CLINICAL DATA:  Fever.  Weakness and headache.  Urinary urgency. EXAM: PORTABLE CHEST 1 VIEW COMPARISON:  Radiograph 12/13/2018 FINDINGS: Upper normal heart size. Coronary stent is visualized. Normal mediastinal contours. Streaky opacities at the right lung base. No pulmonary edema. No pleural fluid or pneumothorax. No acute osseous abnormalities are  seen. IMPRESSION: 1. Streaky right lung base opacities, favor atelectasis, however atypical viral pneumonia could have a similar appearance. 2. Borderline cardiomegaly.  Coronary stent. Electronically Signed   By: Narda Rutherford M.D.   On: 12/14/2019 19:58        Scheduled Meds: . aspirin EC  81 mg Oral Daily  . enoxaparin (LOVENOX) injection  40 mg Subcutaneous QHS  . folic acid  1 mg Oral Daily  . levothyroxine  25 mcg Oral Daily  . losartan  25 mg Oral Daily  . metoprolol tartrate  12.5 mg Oral BID  . pantoprazole  40 mg Oral Daily  . simvastatin  40 mg Oral QHS   Continuous Infusions: . sodium chloride 75 mL/hr at 12/15/19 0115  . azithromycin Stopped (12/15/19 0227)  . cefTRIAXone (ROCEPHIN)  IV Stopped (12/15/19 0227)     LOS: 1 day   Time spent= 35 mins    Lyfe Reihl Joline Maxcy, MD [facility]  If 7PM-7AM, please contact night-coverage  12/15/2019, 8:28 AM

## 2019-12-16 DIAGNOSIS — A071 Giardiasis [lambliasis]: Secondary | ICD-10-CM | POA: Diagnosis present

## 2019-12-16 DIAGNOSIS — A419 Sepsis, unspecified organism: Principal | ICD-10-CM

## 2019-12-16 LAB — COMPREHENSIVE METABOLIC PANEL
ALT: 69 U/L — ABNORMAL HIGH (ref 0–44)
AST: 63 U/L — ABNORMAL HIGH (ref 15–41)
Albumin: 3.1 g/dL — ABNORMAL LOW (ref 3.5–5.0)
Alkaline Phosphatase: 63 U/L (ref 38–126)
Anion gap: 8 (ref 5–15)
BUN: 9 mg/dL (ref 8–23)
CO2: 24 mmol/L (ref 22–32)
Calcium: 7.7 mg/dL — ABNORMAL LOW (ref 8.9–10.3)
Chloride: 101 mmol/L (ref 98–111)
Creatinine, Ser: 0.78 mg/dL (ref 0.61–1.24)
GFR calc Af Amer: >60 mL/min (ref >60)
GFR calc non Af Amer: >60 mL/min (ref >60)
Glucose, Bld: 101 mg/dL — ABNORMAL HIGH (ref 70–99)
Potassium: 3.6 mmol/L (ref 3.5–5.1)
Sodium: 133 mmol/L — ABNORMAL LOW (ref 135–145)
Total Bilirubin: 1 mg/dL (ref 0.3–1.2)
Total Protein: 5.7 g/dL — ABNORMAL LOW (ref 6.5–8.1)

## 2019-12-16 LAB — CBC
HCT: 34.6 % — ABNORMAL LOW (ref 39.0–52.0)
Hemoglobin: 12.6 g/dL — ABNORMAL LOW (ref 13.0–17.0)
MCH: 37.1 pg — ABNORMAL HIGH (ref 26.0–34.0)
MCHC: 36.4 g/dL — ABNORMAL HIGH (ref 30.0–36.0)
MCV: 101.8 fL — ABNORMAL HIGH (ref 80.0–100.0)
Platelets: 97 K/uL — ABNORMAL LOW (ref 150–400)
RBC: 3.4 MIL/uL — ABNORMAL LOW (ref 4.22–5.81)
RDW: 13.2 % (ref 11.5–15.5)
WBC: 6.2 K/uL (ref 4.0–10.5)
nRBC: 0 % (ref 0.0–0.2)

## 2019-12-16 LAB — GASTROINTESTINAL PANEL BY PCR, STOOL (REPLACES STOOL CULTURE)

## 2019-12-16 LAB — MAGNESIUM: Magnesium: 1.9 mg/dL (ref 1.7–2.4)

## 2019-12-16 MED ORDER — ONDANSETRON HCL 4 MG PO TABS: 4.0000 mg | ORAL_TABLET | Freq: Three times a day (TID) | ORAL | 0 refills | Status: AC | PRN

## 2019-12-16 MED ORDER — FLUTICASONE-SALMETEROL 100-50 MCG/DOSE IN AEPB: 1.0000 | INHALATION_SPRAY | Freq: Two times a day (BID) | RESPIRATORY_TRACT | 0 refills | Status: AC

## 2019-12-16 MED ORDER — ALBUTEROL SULFATE HFA 108 (90 BASE) MCG/ACT IN AERS
2.0000 | INHALATION_SPRAY | Freq: Four times a day (QID) | RESPIRATORY_TRACT | 0 refills | Status: AC | PRN
Start: 2019-12-16 — End: ?

## 2019-12-16 MED ORDER — METRONIDAZOLE 500 MG PO TABS
500.0000 mg | ORAL_TABLET | Freq: Three times a day (TID) | ORAL | 0 refills | Status: AC
Start: 2019-12-16 — End: 2019-12-23

## 2019-12-16 MED ORDER — AZITHROMYCIN 250 MG PO TABS
250.0000 mg | ORAL_TABLET | Freq: Every day | ORAL | 0 refills | Status: DC
Start: 2019-12-16 — End: 2019-12-16

## 2019-12-16 MED ORDER — IPRATROPIUM-ALBUTEROL 0.5-2.5 (3) MG/3ML IN SOLN: 3.0000 mL | RESPIRATORY_TRACT | Status: DC | PRN

## 2019-12-16 MED ORDER — METRONIDAZOLE 500 MG PO TABS
500.0000 mg | ORAL_TABLET | Freq: Three times a day (TID) | ORAL | Status: DC
  Administered 2019-12-16: 500 mg via ORAL
  Filled 2019-12-16: qty 1

## 2019-12-16 NOTE — Discharge Instructions (Signed)
What Is Giardiasis? Giardiasis, also known as a giardia infection, is an intestinal disease marked by diarrhea, cramps, nausea, and bloating. A tiny parasite called Giardia intestinalis causes the infection. This bug lives all around the world in places that don't have clean drinking water. It's a common cause of waterborne illness in the [place]  Giardiasis Symptoms Symptoms usually start 1-3 weeks after you're exposed. They'll probably last for 2-6 weeks. The most common include:  Diarrhea Gas or flatulence Greasy stool that floats Stomach or abdominal cramps Upset stomach or nausea Dehydration Weight loss When should I call a doctor?  If you have any of these symptoms for more than a week, call your doctor to see if you should be tested for giardiasis. It's possible for a giardia infection to get better on its own, but you may need to have treatment.  Giardiasis Causes You usually pick up a giardia infection from the stool of an infected person or animal. Before they're pooped out, the parasites grow a hard shell called a cyst. It helps them live for months outside of a body, either in water or on a surface. The infection comes after you come in contact with the parasite or a cyst. You might:  Come into close contact with someone who has giardiasis Touch contaminated surfaces like bathroom handles, changing tables, diaper pails, or toys, then eat without washing your hands Drink water or use ice made from an untreated water source -- like a lake, stream, or well -- that's home to giardia Swallow the parasite while you swim or play in water Eat uncooked food that contains giardia  Giardiasis Risk Factors While anyone can catch giardiasis, some people are more likely to come in contact with the parasite:  Parents and child care workers who change diapers Children in child care centers People who live in the same household as someone with giardiasis Those who drink water or use ice made  from untreated water Backpackers, hikers, and campers who drink unsafe water on the trail  SUGGESTED  Giardiasis Diagnosis To find out if you have giardiasis, you'll need to give your doctor a stool sample. They'll send it to a lab for testing. For the best results, you may need to give samples for testing for several days.  If you do have giardiasis, you'll need to have your stool checked again to see if the parasites have cleared.  Giardiasis Treatment Many people get better without treatment. But your doctor may want to treat you with medication. Metronidazole (Flagyl), nitazoxanide (Alinia), and tinidazole (Tindamax) are among the drugs used to treat giardia infections.  Giardiasis Complications Giardiasis can cause problems even after the infection has gone away. These can be especially serious in infants and children.   Dehydration. This is when your body doesn't have enough water to work normally. It's often the result of severe diarrhea. Failure to thrive. Long-term diarrhea can affect a child's mental and physical growth. Lactose intolerance. Many people who have a giardia infection find they can't digest milk sugar (lactose) the way they used to. This can make it hard to eat milk, cheese, and other dairy foods. Giardiasis Prevention There isn't a vaccine to prevent it, but there are steps you can take:  Wash your hands after you use the toilet, after you change diapers, and before you eat or prepare food. Filter or boil water if you're outdoors. Try not to swallow water when you swim in a pool, lake, or stream. Drink bottled water when  you travel to places with unsafe water. And don't use ice cubes.  [employer] Medical Reference

## 2019-12-16 NOTE — Discharge Summary (Signed)
Physician Discharge Summary  Joshua Mcdaniel ALP:[id number] DOB: 30-Sep-1940 DOA: 12/14/2019  PCP: Joshua Oman, DO  Admit date: 12/14/2019 Discharge date: 12/16/2019  Admitted From: Home Disposition:  Home Recommendations for Outpatient Follow-up:  1. Follow up with PCP in 1-2 weeks 2. Please obtain BMP/CBC in one week  Home Health: No Equipment/Devices: No Discharge Condition:Stable CODE STATUS:Full Diet recommendation: regular   Brief/Interim Summary:  HPI: HPI: Joshua Mcdaniel is a 79 y.o. male with medical history significant of hypertension, hypothyroid, coronary artery disease who presents to the ER with a 5-day history of diarrhea, nausea.  The man began to develop fever and some cough.  He specifically denies urinary urgency or dysuria.  He denies any sick contacts or known Covid exposure, though he reports his daughter does not feel well either.  Has been vaccinated against Covid in March.  Reports fever to 103 this a.m. ED Course: In the ED he was found to have gallstones with some pericholecystic fluid, but no Murphy sign, gallbladder wall thickening, or dilated ducts.  Urinalysis was completely normal.  Chest x-ray showed right basilar infiltrate versus atelectasis.  He was noted to have a lactic acid of 2.4 was given IV fluid hydration and that came down to 0.9.  He was also started on broad-spectrum antibiotics at that time to include cefepime, vancomycin, and Flagyl.  We are asked to admit for sepsis.   Hospital Course:  Sepsis, POA- giardia/diarrhea Nausea, diarrhea and vomiting, nonspecific Cholelithiasis without acute cholecystitis Right base opacity/atelectasis  -Sepsis physiology resolved. Empirically patient is on Rocephin and azithromycin -Very mild transaminitis -Bronchodilators, I-S, flutter -C. difficile-negative, GI panel-positive for Giardia. This was discussed with the patient in detail. He reports having a well at home. He understands he will have to have  the water tested to assure it is safe to drink. He verbalized understanding in regards to giardia being potentially contagious (oral-fecal). He was started on metronidazole and will continue treatment with this medication x 7 days after discharge and will follow up with his PCP.  -CT abdomen pelvis-negative -Right upper quadrant ultrasound-cholelithiasis but no obvious evidence of acute cholecystitis.  -His diarrhea resolved prior to discharge and he had no abdominal pain.  -Patient with no cough or dyspnea to suggest pna, Rocephin and Azithromycin discontinued.   Essential hypertension -Resume his home medications upon discharge.   Coronary artery disease status post PCI -Currently chest pain-free on aspirin and statin.  Continue metoprolol, losartan  GERD -Protonix 40 mg daily  Hyperlipidemia -Zocor 40 mg daily  Hypothyroidism -Synthroid 25 mcg daily  COPD Patient reports having albuterol inhaler at home but may need a refill. He used to be on a different inhaler, willing to try Advair, Rx was sent to his pharmacy.     Discharge Diagnoses:  Active Problems:   Atherosclerotic heart disease of native coronary artery without angina pectoris   COPD (chronic obstructive pulmonary disease) (HCC)   Essential hypertension   Gastro-esophageal reflux disease without esophagitis   H/O heart artery stent   Hyperlipidemia   Hypothyroidism   Spinal stenosis   Giardial enteritis    Discharge Instructions  Discharge Instructions    Diet - low sodium heart healthy   Complete by: As directed    Increase activity slowly   Complete by: As directed      Allergies as of 12/16/2019      Reactions   Lisinopril Cough   Penicillins Diarrhea   Did it involve swelling of the face/tongue/throat,  SOB, or low BP? N Did it involve sudden or severe rash/hives, skin peeling, or any reaction on the inside of your mouth or nose? No but caused nausea, vomiting and diarrhea Did you need to  seek medical attention at a hospital or doctor's office? Unknown When did it last happen?Several Years Ago  If all above answers are "NO", may proceed with cephalosporin use.      Medication List    TAKE these medications   albuterol 108 (90 Base) MCG/ACT inhaler Commonly known as: VENTOLIN HFA Inhale 2 puffs into the lungs every 6 (six) hours as needed for wheezing or shortness of breath.   Fluticasone-Salmeterol 100-50 MCG/DOSE Aepb Commonly known as: Advair Diskus Inhale 1 puff into the lungs 2 (two) times daily.   folic acid 1 MG tablet Commonly known as: FOLVITE Take 1 mg by mouth daily.   furosemide 20 MG tablet Commonly known as: LASIX Take 20 mg by mouth daily.   levothyroxine 25 MCG tablet Commonly known as: SYNTHROID Take 25 mcg by mouth daily.   losartan 25 MG tablet Commonly known as: COZAAR Take 25 mg by mouth daily.   metoprolol tartrate 25 MG tablet Commonly known as: LOPRESSOR Take 12.5 mg by mouth 2 (two) times daily.   metroNIDAZOLE 500 MG tablet Commonly known as: Flagyl Take 1 tablet (500 mg total) by mouth 3 (three) times daily for 7 days.   nitroGLYCERIN 0.4 MG SL tablet Commonly known as: NITROSTAT Place 0.4 mg under the tongue every 5 (five) minutes as needed for chest pain.   omeprazole 20 MG capsule Commonly known as: PRILOSEC Take 20 mg by mouth daily as needed (indigestion).   ondansetron 4 MG tablet Commonly known as: Zofran Take 1 tablet (4 mg total) by mouth every 8 (eight) hours as needed for nausea or vomiting.   simvastatin 40 MG tablet Commonly known as: ZOCOR Take 40 mg by mouth at bedtime.   traZODone 150 MG tablet Commonly known as: DESYREL Take 150 mg by mouth at bedtime as needed for sleep.       Follow-up Information    Yolanda Manges, DO. Schedule an appointment as soon as possible for a visit in 1 week(s).   Specialty: Internal Medicine Contact information: [address] [place]  [place] [address] [phone]              Allergies  Allergen Reactions  . Lisinopril Cough  . Penicillins Diarrhea    Did it involve swelling of the face/tongue/throat, SOB, or low BP? N Did it involve sudden or severe rash/hives, skin peeling, or any reaction on the inside of your mouth or nose? No but caused nausea, vomiting and diarrhea Did you need to seek medical attention at a hospital or doctor's office? Unknown When did it last happen?Several Years Ago  If all above answers are "NO", may proceed with cephalosporin use.      Consultations:  None   Procedures/Studies: CT Abdomen Pelvis W Contrast  Result Date: 12/14/2019 CLINICAL DATA:  Fever, weakness, nausea, vomiting, and diarrhea. EXAM: CT ABDOMEN AND PELVIS WITH CONTRAST TECHNIQUE: Multidetector CT imaging of the abdomen and pelvis was performed using the standard protocol following bolus administration of intravenous contrast. CONTRAST:  OMNIPAQUE IOHEXOL 300 MG/ML  SOLN COMPARISON:  CT abdomen pelvis dated April 10, 2018. FINDINGS: Lower chest: No acute abnormality. Hepatobiliary: No focal liver abnormality is seen. Mild periportal edema. Small gallstones. No gallbladder wall thickening or biliary dilatation. Pancreas: Unremarkable.  No pancreatic ductal dilatation or surrounding inflammatory changes. Spleen: Normal in size without focal abnormality. Adrenals/Urinary Tract: Adrenal glands are unremarkable. Unchanged small left renal cysts. Chronic scarring in both upper poles again noted. No renal calculi or hydronephrosis. Bladder is unremarkable for the degree of distention. Stomach/Bowel: Stomach is within normal limits. Diminutive or absent appendix. No evidence of bowel wall thickening, distention, or inflammatory changes. Mild left-sided colonic diverticulosis. Vascular/Lymphatic: Aortic atherosclerosis. No enlarged abdominal or pelvic lymph nodes. Reproductive: Prostate is unremarkable. Other: Unchanged small  fat containing left greater than right inguinal hernias. No free fluid or pneumoperitoneum. Musculoskeletal: No acute or significant osseous findings. Prior lumbar fusion. Chronic L2 compression deformity. IMPRESSION: 1. No acute intra-abdominal process. 2. Unchanged cholelithiasis. 3. Aortic Atherosclerosis (ICD10-I70.0). Electronically Signed   By: Obie Dredge M.D.   On: 12/14/2019 20:20   US Abdomen Limited  Result Date: 12/14/2019 CLINICAL DATA:  Fever EXAM: ULTRASOUND ABDOMEN LIMITED RIGHT UPPER QUADRANT COMPARISON:  CT today FINDINGS: Gallbladder: Small stones seen within the gallbladder. No wall thickening or sonographic Murphy's sign. Small amount of pericholecystic fluid suspected. Common bile duct: Diameter: Normal caliber, 4 mm Liver: No focal lesion identified. Within normal limits in parenchymal echogenicity. Portal vein is patent on color Doppler imaging with normal direction of blood flow towards the liver. Other: None. IMPRESSION: Cholelithiasis. There is no wall thickening or sonographic Murphy's sign. There appears to be a small amount of pericholecystic fluid. Recommend clinical correlation to completely exclude acute cholecystitis. Electronically Signed   By: Charlett Nose M.D.   On: 12/14/2019 21:47   DG Chest Port 1 View  Result Date: 12/14/2019 CLINICAL DATA:  Fever.  Weakness and headache.  Urinary urgency. EXAM: PORTABLE CHEST 1 VIEW COMPARISON:  Radiograph 12/13/2018 FINDINGS: Upper normal heart size. Coronary stent is visualized. Normal mediastinal contours. Streaky opacities at the right lung base. No pulmonary edema. No pleural fluid or pneumothorax. No acute osseous abnormalities are seen. IMPRESSION: 1. Streaky right lung base opacities, favor atelectasis, however atypical viral pneumonia could have a similar appearance. 2. Borderline cardiomegaly.  Coronary stent. Electronically Signed   By: Narda Rutherford M.D.   On: 12/14/2019 19:58       Subjective: Patient  reports that his diarrhea has resolved. He has no dyspnea. He reports history of COPD. He is eager to go home.   Discharge Exam: Vitals:   12/16/19 1007 12/16/19 1410  BP: 130/68 (!) 150/76  Pulse: 68 76  Resp:  16  Temp:  98 F (36.7 C)  SpO2:  100%   Vitals:   12/16/19 0535 12/16/19 0806 12/16/19 1007 12/16/19 1410  BP: (!) 142/66  130/68 (!) 150/76  Pulse: 69  68 76  Resp: 15   16  Temp: 98.6 F (37 C)   98 F (36.7 C)  TempSrc: Oral     SpO2: 100% 100%  100%  Weight:      Height:        General: Pt is alert, awake, not in acute distress Cardiovascular: RRR, S1/S2  Respiratory: No respiratory distress, mild wheezing at the bases (reported as chronic for him), no cough Abdominal: Soft, NT, ND Extremities: no edema, no cyanosis Neuro: Alert and oriented x3    The results of significant diagnostics from this hospitalization (including imaging, microbiology, ancillary and laboratory) are listed below for reference.     Microbiology: Recent Results (from the past 240 hour(s))  Urine Culture     Status: None   Collection Time: 12/14/19  5:04 PM   Specimen: Urine, Random  Result Value Ref Range Status   Specimen Description   Final    URINE, RANDOM Performed at [facility], [address] [place], [place] [address]    Special Requests   Final    NONE Performed at [facility], [address], [place], [place] [address]    Culture   Final    NO GROWTH Performed at [facility], [address] [place], [place] [address]    Report Status 12/15/2019 FINAL  Final  Blood culture (routine x 2)     Status: None (Preliminary result)   Collection Time: 12/14/19  5:14 PM   Specimen: BLOOD  Result Value Ref Range Status   Specimen Description   Final    BLOOD RIGHT HAND Performed at [facility], [address], [place], [place] [address]    Special Requests   Final    BOTTLES DRAWN AEROBIC AND  ANAEROBIC Blood Culture adequate volume Performed at [facility], [address] [place], [place] [address]    Culture   Final    NO GROWTH 2 DAYS Performed at [facility], [address], [place], [place] [address]    Report Status PENDING  Incomplete  Blood culture (routine x 2)     Status: None (Preliminary result)   Collection Time: 12/14/19  5:19 PM   Specimen: BLOOD  Result Value Ref Range Status   Specimen Description   Final    BLOOD RIGHT ANTECUBITAL Performed at [facility], [address] [place], [place] [address]    Special Requests   Final    BOTTLES DRAWN AEROBIC AND ANAEROBIC Blood Culture results may not be optimal due to an excessive volume of blood received in culture bottles Performed at [facility], [address] [place], [place] [address]    Culture   Final    NO GROWTH 2 DAYS Performed at [facility], [address], [place], [place] [address]    Report Status PENDING  Incomplete  SARS Coronavirus 2 by RT PCR (hospital order, performed in [facility] hospital lab) Nasopharyngeal Nasopharyngeal Swab     Status: None   Collection Time: 12/14/19 10:28 PM   Specimen: Nasopharyngeal Swab  Result Value Ref Range Status   SARS Coronavirus 2 NEGATIVE NEGATIVE Final    Comment: (NOTE) SARS-CoV-2 target nucleic acids are NOT DETECTED.  The SARS-CoV-2 RNA is generally detectable in upper and lower respiratory specimens during the acute phase of infection. The lowest concentration of SARS-CoV-2 viral copies this assay can detect is 250 copies / mL. A negative result does not preclude SARS-CoV-2 infection and should not be used as the sole basis for treatment or other patient management decisions.  A negative result may occur with improper specimen collection / handling, submission of specimen other than nasopharyngeal swab, presence of viral mutation(s) within the areas targeted by this  assay, and inadequate number of viral copies (<250 copies / mL). A negative result must be combined with clinical observations, patient history, and epidemiological information.  Fact Sheet for Patients:   BoilerBrush.com.cy  Fact Sheet for Healthcare Providers: https://pope.com/  This test is not yet approved or  cleared by the [place] FDA and has been authorized for detection and/or diagnosis of SARS-CoV-2 by FDA under an Emergency Use Authorization (EUA).  This EUA will remain in effect (meaning this test can be  used) for the duration of the COVID-19 declaration under Section 564(b)(1) of the Act, 21 U.S.C. section 360bbb-3(b)(1), unless the authorization is terminated or revoked sooner.  Performed at [facility], [address], [place], [place] [address]   C Difficile Quick Screen w PCR reflex     Status: None   Collection Time: 12/15/19  6:08 AM   Specimen: STOOL  Result Value Ref Range Status   C Diff antigen NEGATIVE NEGATIVE Final   C Diff toxin NEGATIVE NEGATIVE Final   C Diff interpretation No C. difficile detected.  Final    Comment: VALID Performed at [facility], [address], [place], [place] [address]   Gastrointestinal Panel by PCR , Stool     Status: Abnormal   Collection Time: 12/15/19  6:08 AM   Specimen: Stool  Result Value Ref Range Status   Campylobacter species NOT DETECTED NOT DETECTED Final   Plesimonas shigelloides NOT DETECTED NOT DETECTED Final   Salmonella species NOT DETECTED NOT DETECTED Final   Yersinia enterocolitica NOT DETECTED NOT DETECTED Final   Vibrio species NOT DETECTED NOT DETECTED Final   Vibrio cholerae NOT DETECTED NOT DETECTED Final   Enteroaggregative E coli (EAEC) NOT DETECTED NOT DETECTED Final   Enteropathogenic E coli (EPEC) NOT DETECTED NOT DETECTED Final   Enterotoxigenic E coli (ETEC) NOT DETECTED NOT DETECTED Final   Shiga  like toxin producing E coli (STEC) NOT DETECTED NOT DETECTED Final   Shigella/Enteroinvasive E coli (EIEC) NOT DETECTED NOT DETECTED Final   Cryptosporidium NOT DETECTED NOT DETECTED Final   Cyclospora cayetanensis NOT DETECTED NOT DETECTED Final   Entamoeba histolytica NOT DETECTED NOT DETECTED Final   Giardia lamblia DETECTED (A) NOT DETECTED Final   Adenovirus F40/41 NOT DETECTED NOT DETECTED Final   Astrovirus NOT DETECTED NOT DETECTED Final   Norovirus GI/GII NOT DETECTED NOT DETECTED Final   Rotavirus A NOT DETECTED NOT DETECTED Final   Sapovirus (I, II, IV, and V) NOT DETECTED NOT DETECTED Final    Comment: Performed at [facility], [address] [place], [place] [address]     Labs: BNP (last 3 results) No results for input(s): BNP in the last 8760 hours. Basic Metabolic Panel: Recent Labs  Lab 12/14/19 1526 12/15/19 0001 12/15/19 0700 12/16/19 0452  NA 131*  --  133* 133*  K 3.7  --  3.6 3.6  CL 94*  --  99 101  CO2 26  --  24 24  GLUCOSE 146*  --  95 101*  BUN 18  --  13 9  CREATININE 1.19 0.90 0.90 0.78  CALCIUM 8.3*  --  8.0* 7.7*  MG  --   --   --  1.9   Liver Function Tests: Recent Labs  Lab 12/14/19 1526 12/15/19 0700 12/16/19 0452  AST 61* 75* 63*  ALT 58* 69* 69*  ALKPHOS 46 48 63  BILITOT 1.4* 1.3* 1.0  PROT 6.5 6.4* 5.7*  ALBUMIN 3.6 3.6 3.1*   No results for input(s): LIPASE, AMYLASE in the last 168 hours. No results for input(s): AMMONIA in the last 168 hours. CBC: Recent Labs  Lab 12/14/19 1526 12/15/19 0001 12/15/19 0700 12/16/19 0452  WBC 10.2 9.2 10.1 6.2  HGB 14.4 13.3 13.7 12.6*  HCT 39.8 36.9* 39.0 34.6*  MCV 101.8* 100.5* 101.6* 101.8*  PLT 125* 103* 110* 97*   Cardiac Enzymes: No results for input(s): CKTOTAL, CKMB, CKMBINDEX, TROPONINI in the last 168 hours. BNP: Invalid  input(s): POCBNP CBG: No results for input(s): GLUCAP in the last 168 hours. D-Dimer No results for input(s): DDIMER in the last 72  hours. Hgb A1c Recent Labs    12/14/19 1526  HGBA1C 5.3   Lipid Profile No results for input(s): CHOL, HDL, LDLCALC, TRIG, CHOLHDL, LDLDIRECT in the last 72 hours. Thyroid function studies Recent Labs    12/15/19 0055  TSH 2.559   Anemia work up No results for input(s): VITAMINB12, FOLATE, FERRITIN, TIBC, IRON, RETICCTPCT in the last 72 hours. Urinalysis    Component Value Date/Time   COLORURINE YELLOW 12/14/2019 1704   APPEARANCEUR CLEAR 12/14/2019 1704   LABSPEC 1.018 12/14/2019 1704   PHURINE 5.0 12/14/2019 1704   GLUCOSEU NEGATIVE 12/14/2019 1704   HGBUR NEGATIVE 12/14/2019 1704   BILIRUBINUR NEGATIVE 12/14/2019 1704   KETONESUR NEGATIVE 12/14/2019 1704   PROTEINUR NEGATIVE 12/14/2019 1704   NITRITE NEGATIVE 12/14/2019 1704   LEUKOCYTESUR NEGATIVE 12/14/2019 1704   Sepsis Labs Invalid input(s): PROCALCITONIN,  WBC,  LACTICIDVEN Microbiology Recent Results (from the past 240 hour(s))  Urine Culture     Status: None   Collection Time: 12/14/19  5:04 PM   Specimen: Urine, Random  Result Value Ref Range Status   Specimen Description   Final    URINE, RANDOM Performed at [facility], [address] [place], [place] [address]    Special Requests   Final    NONE Performed at [facility], [address] [place], [place] [address]    Culture   Final    NO GROWTH Performed at [facility], [address] [place], [place] [address]    Report Status 12/15/2019 FINAL  Final  Blood culture (routine x 2)     Status: None (Preliminary result)   Collection Time: 12/14/19  5:14 PM   Specimen: BLOOD  Result Value Ref Range Status   Specimen Description   Final    BLOOD RIGHT HAND Performed at [facility], [address] [place], [place] [address]    Special Requests   Final    BOTTLES DRAWN AEROBIC AND ANAEROBIC Blood Culture adequate volume Performed at [facility], [address] [place], [place] [address]    Culture   Final    NO GROWTH 2 DAYS Performed at [facility], [address] [place], [place] [address]    Report Status PENDING  Incomplete  Blood culture (routine x 2)     Status: None (Preliminary result)   Collection Time: 12/14/19  5:19 PM   Specimen: BLOOD  Result Value Ref Range Status   Specimen Description   Final    BLOOD RIGHT ANTECUBITAL Performed at [facility], [address] [place], [place] [address]    Special Requests   Final    BOTTLES DRAWN AEROBIC AND ANAEROBIC Blood Culture results may not be optimal due to an excessive volume of blood received in culture bottles Performed at [facility], [address] [place], [place] [address]    Culture   Final    NO GROWTH 2 DAYS Performed at [facility], [address] [place], [place] [address]    Report Status PENDING  Incomplete  SARS Coronavirus 2 by RT PCR (hospital order, performed in [facility] hospital lab) Nasopharyngeal Nasopharyngeal Swab     Status: None   Collection Time: 12/14/19 10:28 PM   Specimen: Nasopharyngeal Swab  Result Value Ref  Range Status   SARS Coronavirus 2 NEGATIVE NEGATIVE Final    Comment: (NOTE) SARS-CoV-2 target nucleic acids are NOT DETECTED.  The SARS-CoV-2 RNA is generally detectable in upper and lower respiratory specimens during the acute phase of infection. The lowest concentration of SARS-CoV-2 viral copies this assay can detect is 250 copies / mL. A negative result does not preclude SARS-CoV-2 infection and should not be used as the sole basis for treatment or other patient management decisions.  A negative result may occur with improper specimen collection / handling, submission of specimen other than nasopharyngeal swab, presence of viral mutation(s) within the areas targeted by this assay, and inadequate number of viral copies (<250 copies / mL). A negative result must be  combined with clinical observations, patient history, and epidemiological information.  Fact Sheet for Patients:   BoilerBrush.com.cy  Fact Sheet for Healthcare Providers: https://pope.com/  This test is not yet approved or  cleared by the [place] FDA and has been authorized for detection and/or diagnosis of SARS-CoV-2 by FDA under an Emergency Use Authorization (EUA).  This EUA will remain in effect (meaning this test can be used) for the duration of the COVID-19 declaration under Section 564(b)(1) of the Act, 21 U.S.C. section 360bbb-3(b)(1), unless the authorization is terminated or revoked sooner.  Performed at [facility], [address], [place], [place] [address]   C Difficile Quick Screen w PCR reflex     Status: None   Collection Time: 12/15/19  6:08 AM   Specimen: STOOL  Result Value Ref Range Status   C Diff antigen NEGATIVE NEGATIVE Final   C Diff toxin NEGATIVE NEGATIVE Final   C Diff interpretation No C. difficile detected.  Final    Comment: VALID Performed at [facility], [address], [place], [place] [address]   Gastrointestinal Panel by PCR , Stool     Status: Abnormal   Collection Time: 12/15/19  6:08 AM   Specimen: Stool  Result Value Ref Range Status   Campylobacter species NOT DETECTED NOT DETECTED Final   Plesimonas shigelloides NOT DETECTED NOT DETECTED Final   Salmonella species NOT DETECTED NOT DETECTED Final   Yersinia enterocolitica NOT DETECTED NOT DETECTED Final   Vibrio species NOT DETECTED NOT DETECTED Final   Vibrio cholerae NOT DETECTED NOT DETECTED Final   Enteroaggregative E coli (EAEC) NOT DETECTED NOT DETECTED Final   Enteropathogenic E coli (EPEC) NOT DETECTED NOT DETECTED Final   Enterotoxigenic E coli (ETEC) NOT DETECTED NOT DETECTED Final   Shiga like toxin producing E coli (STEC) NOT DETECTED NOT DETECTED Final   Shigella/Enteroinvasive  E coli (EIEC) NOT DETECTED NOT DETECTED Final   Cryptosporidium NOT DETECTED NOT DETECTED Final   Cyclospora cayetanensis NOT DETECTED NOT DETECTED Final   Entamoeba histolytica NOT DETECTED NOT DETECTED Final   Giardia lamblia DETECTED (A) NOT DETECTED Final   Adenovirus F40/41 NOT DETECTED NOT DETECTED Final   Astrovirus NOT DETECTED NOT DETECTED Final   Norovirus GI/GII NOT DETECTED NOT DETECTED Final   Rotavirus A NOT DETECTED NOT DETECTED Final   Sapovirus (I, II, IV, and V) NOT DETECTED NOT DETECTED Final    Comment: Performed at [facility], [address] [place], [place] [address]     Time coordinating discharge: Over 33 minutes  SIGNED:   Ky Barban, MD  [facility] 12/16/2019, 3:29 PM Pager   If 7PM-7AM, please contact night-coverage [email or web address] Password [email or web address]

## 2019-12-16 NOTE — Progress Notes (Signed)
Assessment unchanged, Pt verbalized understanding of dc instructions including antibiotic to continue for 7 days, as well as other meds to resume, and follow up  Care. Discharged via wc to front entrance to meet daughter accompanied by NT.

## 2019-12-17 LAB — LEGIONELLA PNEUMOPHILA SEROGP 1 UR AG: L. pneumophila Serogp 1 Ur Ag: NEGATIVE

## 2019-12-19 LAB — CULTURE, BLOOD (ROUTINE X 2)
Culture: NO GROWTH
Culture: NO GROWTH
Special Requests: ADEQUATE

## 2021-02-19 IMAGING — CT CT ABD-PELV W/ CM
2 of 5 series · 17 of 46 positions shown, 19 images · IV contrast (omnipaque)
Comparison: CT abdomen pelvis dated April 10, 2018.

CLINICAL DATA: Fever, weakness, nausea, vomiting, and diarrhea.

EXAM:
CT ABDOMEN AND PELVIS WITH CONTRAST
TECHNIQUE: Multidetector CT imaging of the abdomen and pelvis was performed
using the standard protocol following bolus administration of
intravenous contrast.
CONTRAST:  100mL OMNIPAQUE IOHEXOL 300 MG/ML  SOLN

[Series 2: axial st · axial · 0.88mm/px · z∈[+953,+1333]mm · 14 of 88 slices shown, 16 images]
[im 6/88  soft-tissue]
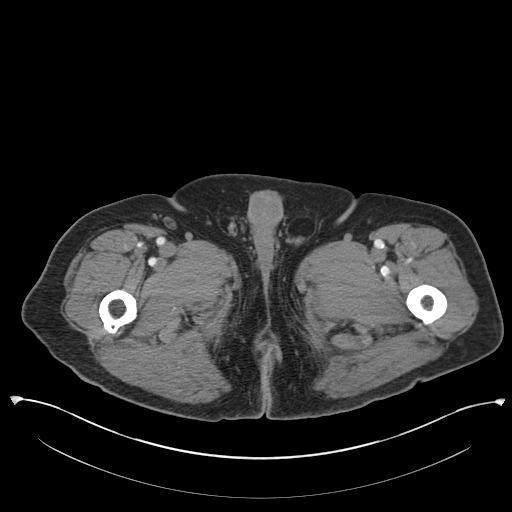
[im 6/88  bone]
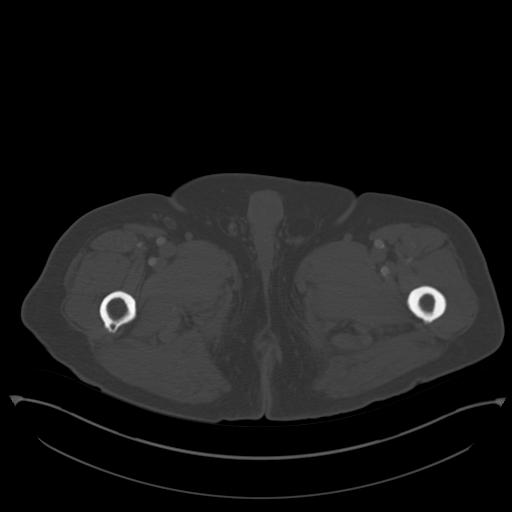
[im 12/88  soft-tissue]
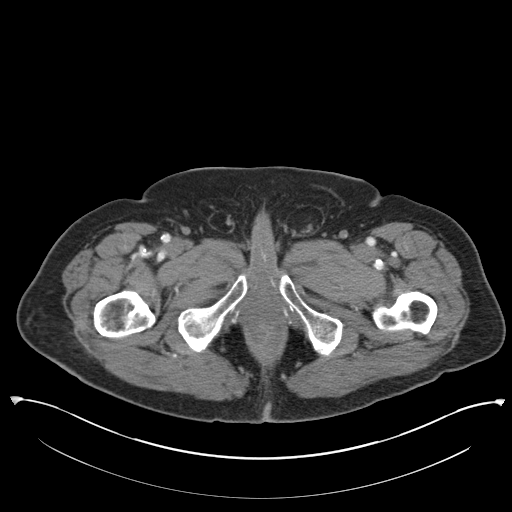
[im 18/88  soft-tissue]
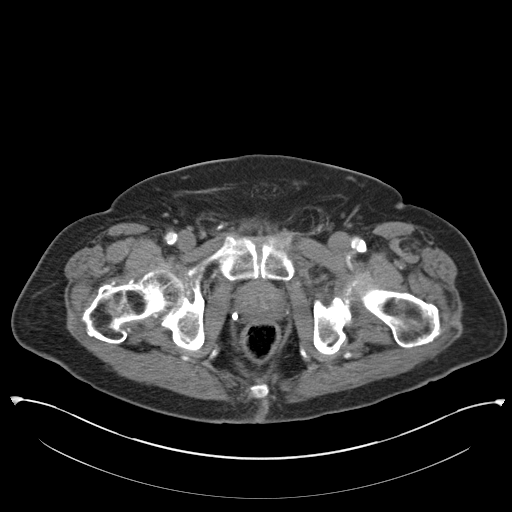
[im 24/88  soft-tissue]
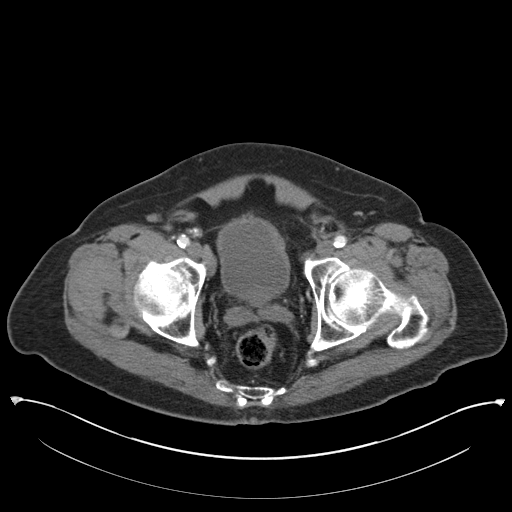
[im 30/88  soft-tissue]
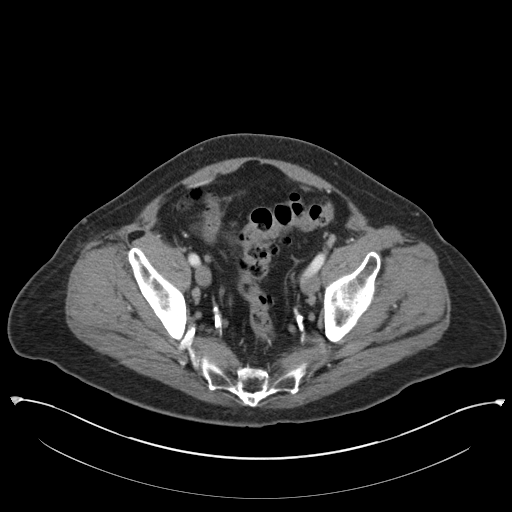
[im 35/88  soft-tissue]
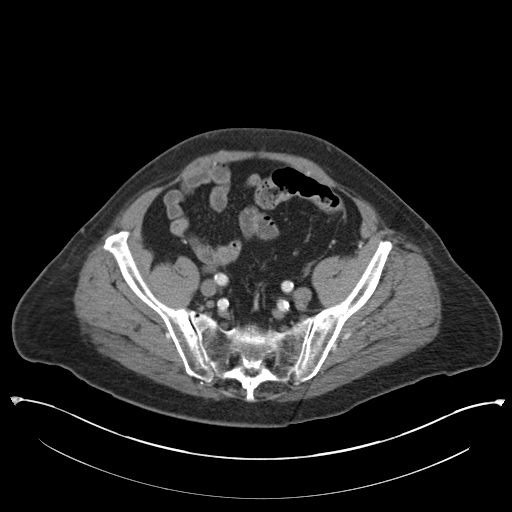
[im 41/88  soft-tissue]
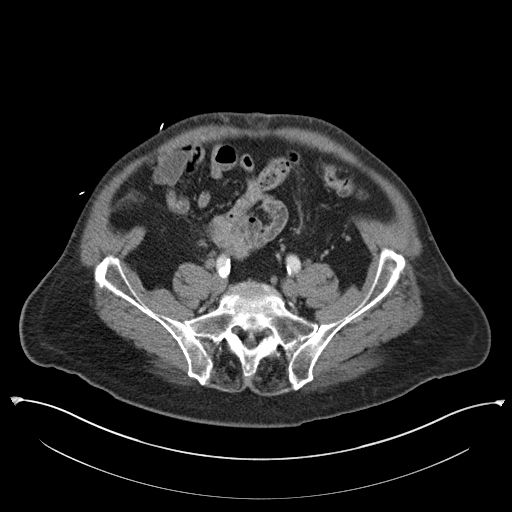
[im 47/88  soft-tissue]
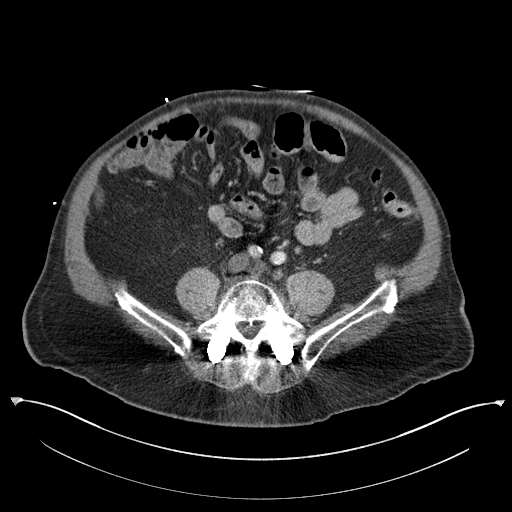
[im 53/88  soft-tissue]
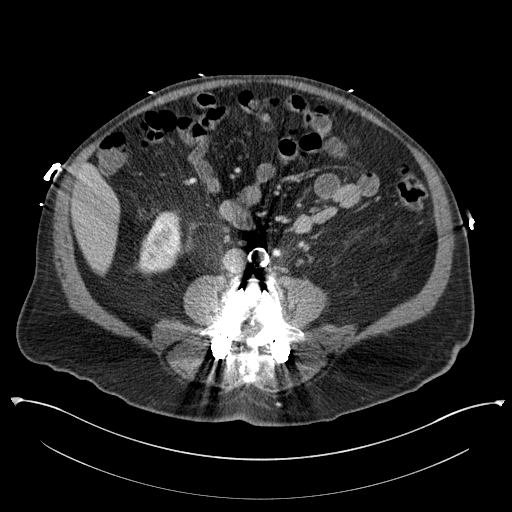
[im 53/88  bone]
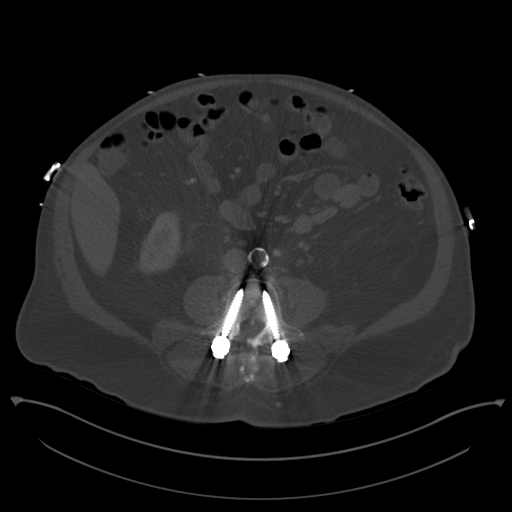
[im 59/88  soft-tissue]
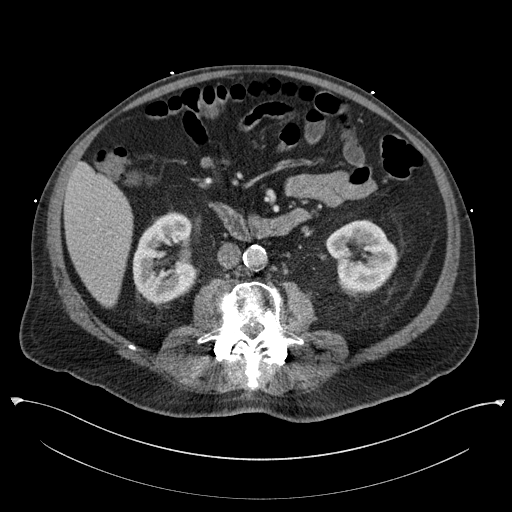
[im 64/88  soft-tissue]
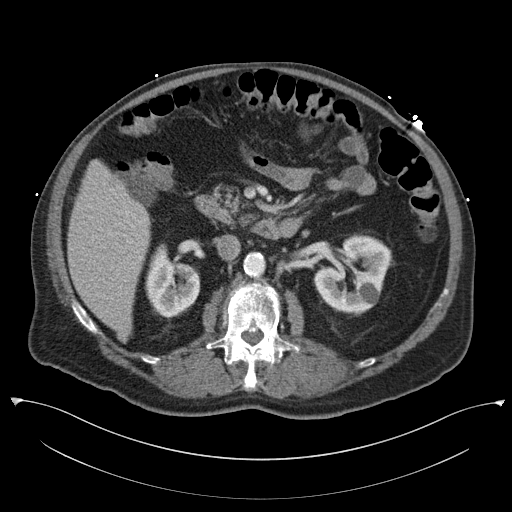
[im 70/88  soft-tissue]
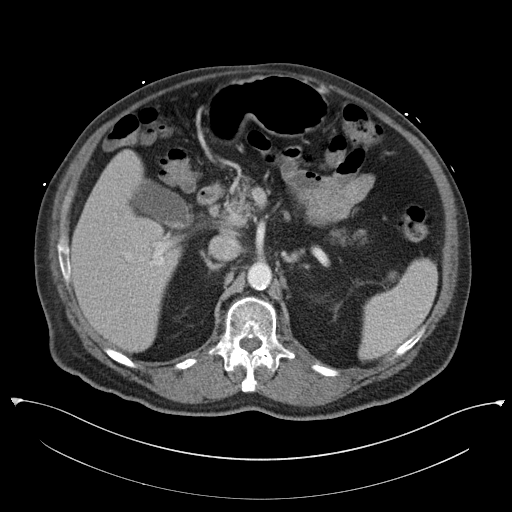
[im 76/88  soft-tissue]
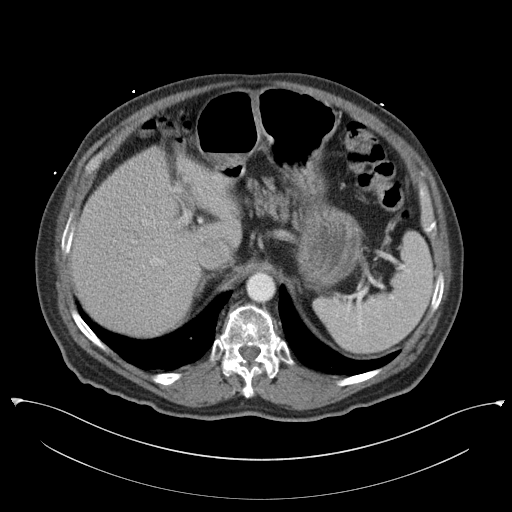
[im 82/88  soft-tissue]
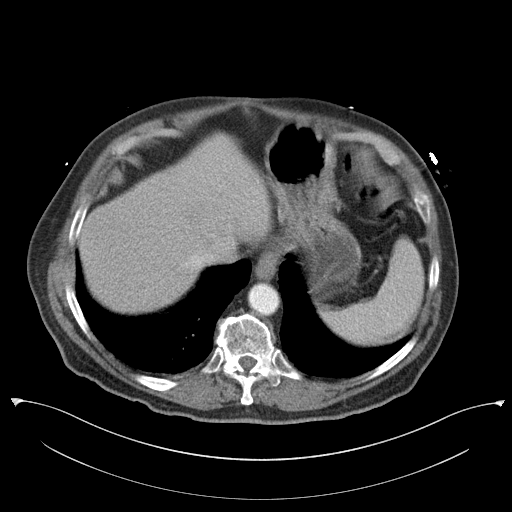

[Series 5: coronal st · coronal · 0.83mm/px · 3 of 169 slices shown]
[im 57/169  soft-tissue]
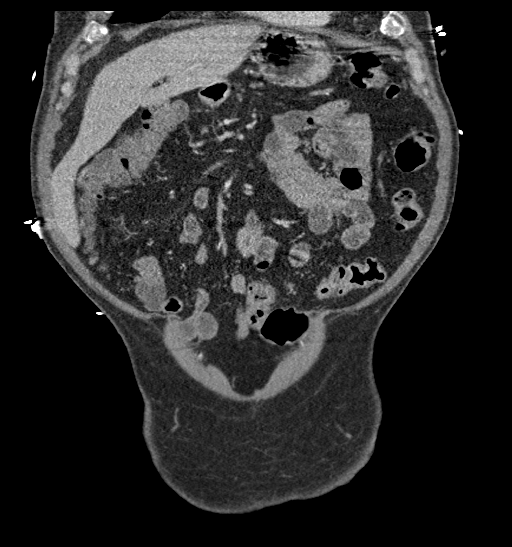
[im 75/169  soft-tissue]
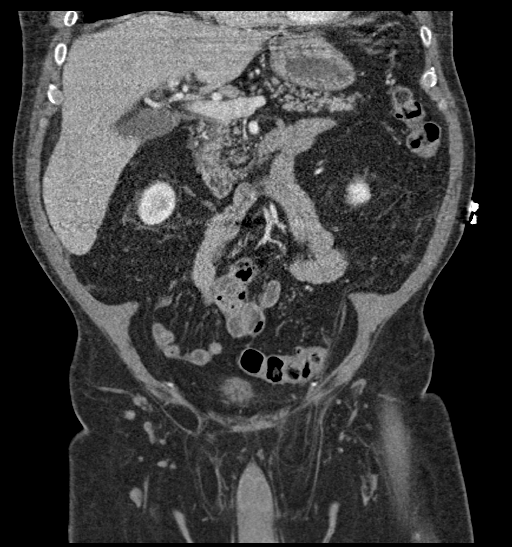
[im 94/169  soft-tissue]
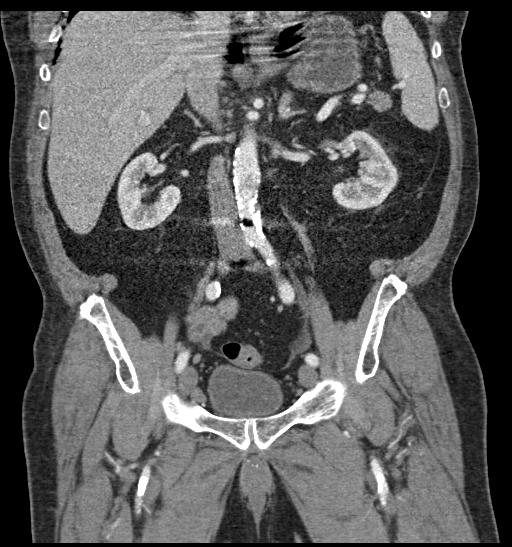

[17 of 46 positions shown; findings below may reference images not displayed]

FINDINGS: Lower chest: No acute abnormality.

Hepatobiliary: No focal liver abnormality is seen. Mild periportal
edema. Small gallstones. No gallbladder wall thickening or biliary
dilatation.

Pancreas: Unremarkable. No pancreatic ductal dilatation or
surrounding inflammatory changes.

Spleen: Normal in size without focal abnormality.

Adrenals/Urinary Tract: Adrenal glands are unremarkable. Unchanged
small left renal cysts. Chronic scarring in both upper poles again
noted. No renal calculi or hydronephrosis. Bladder is unremarkable
for the degree of distention.

Stomach/Bowel: Stomach is within normal limits. Diminutive or absent
appendix. No evidence of bowel wall thickening, distention, or
inflammatory changes. Mild left-sided colonic diverticulosis.

Vascular/Lymphatic: Aortic atherosclerosis. No enlarged abdominal or
pelvic lymph nodes.

Reproductive: Prostate is unremarkable.

Other: Unchanged small fat containing left greater than right
inguinal hernias. No free fluid or pneumoperitoneum.

Musculoskeletal: No acute or significant osseous findings. Prior
lumbar fusion. Chronic L2 compression deformity.
IMPRESSION: 1. No acute intra-abdominal process.
2. Unchanged cholelithiasis.
3. Aortic Atherosclerosis ([id number]).

## 2021-02-19 IMAGING — US US ABDOMEN LIMITED
1 series · 14 of 25 positions shown · non-contrast
Comparison: CT today

CLINICAL DATA: Fever

EXAM:
ULTRASOUND ABDOMEN LIMITED RIGHT UPPER QUADRANT

[Series 1: us abdomen limited · 14 of 46 slices shown]
[im 1/46]
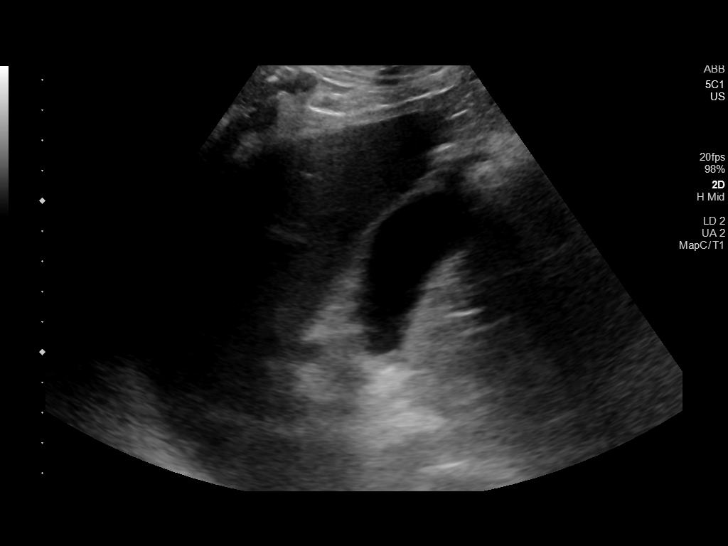
[im 4/46]
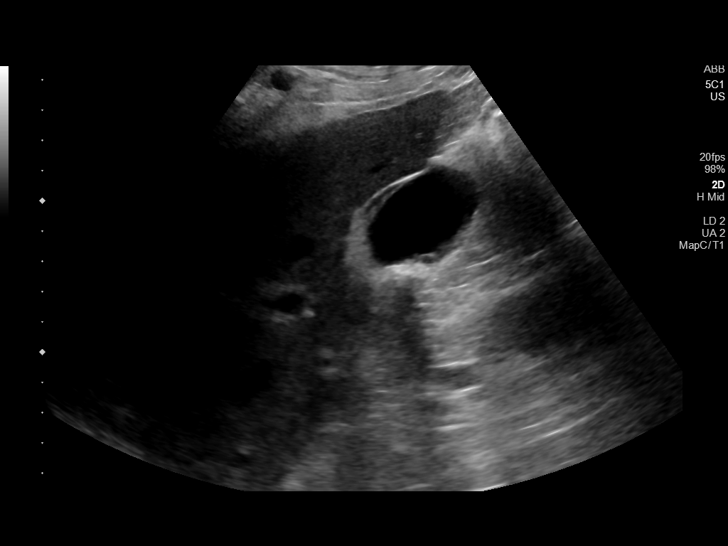
[im 8/46]
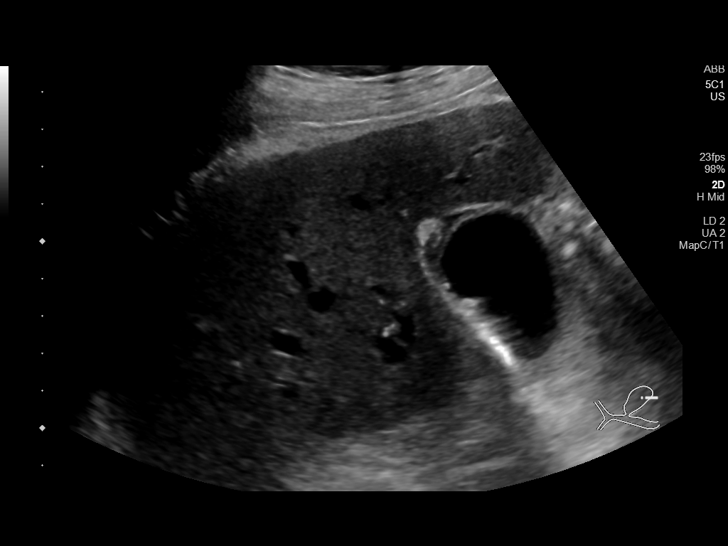
[im 12/46]
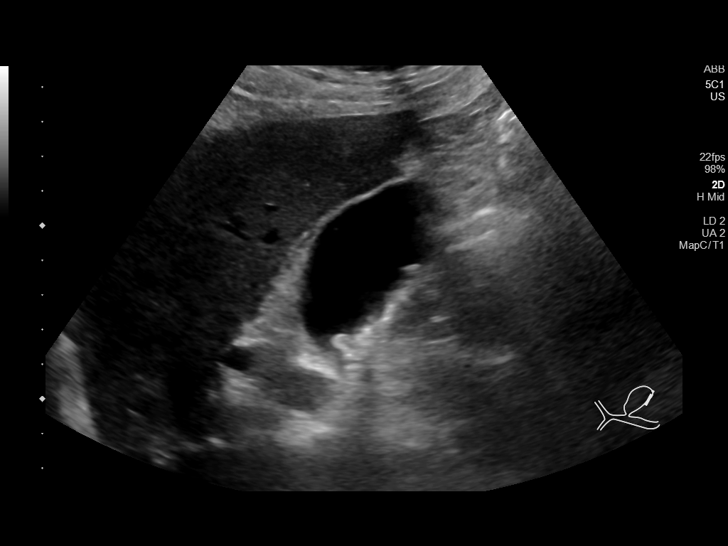
[im 16/46]
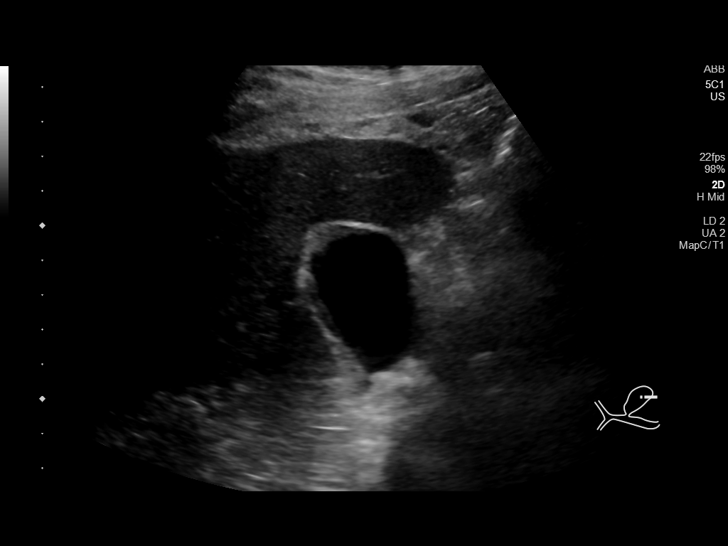
[im 17/46]
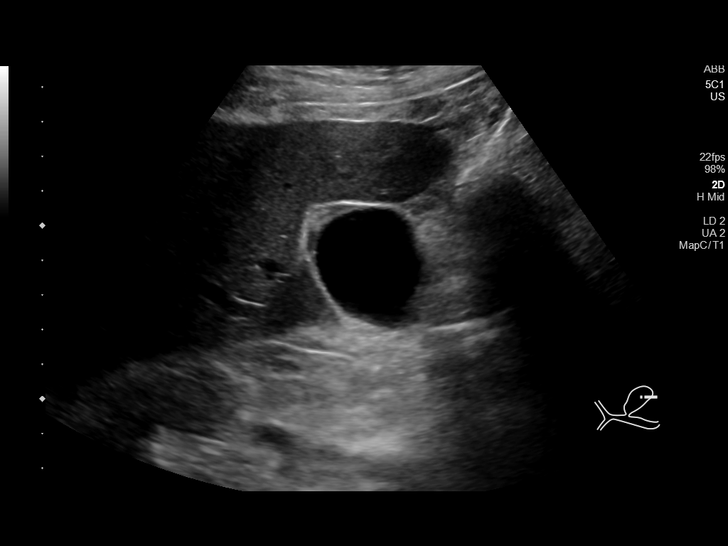
[im 21/46]
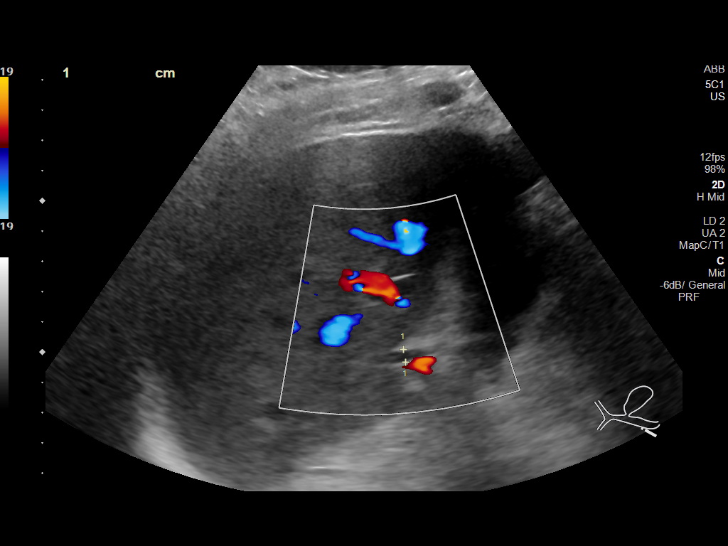
[im 25/46]
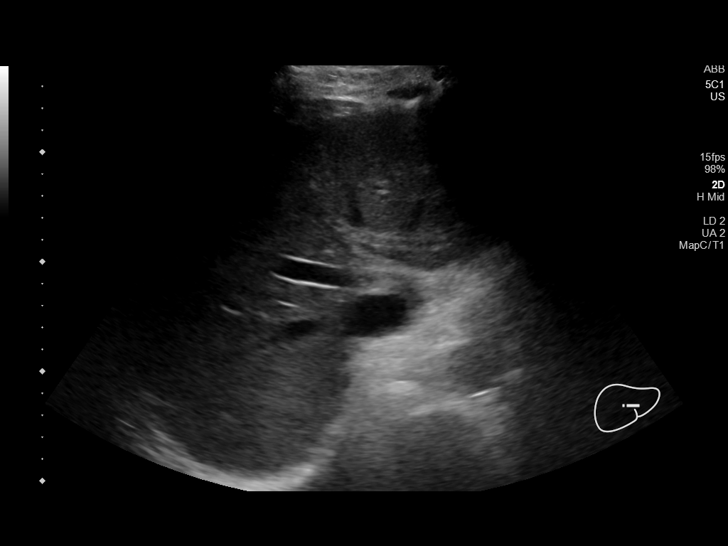
[im 29/46]
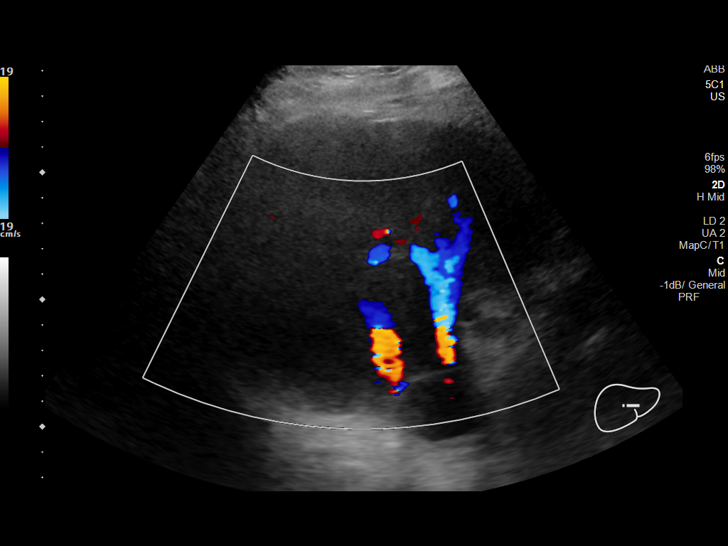
[im 31/46]
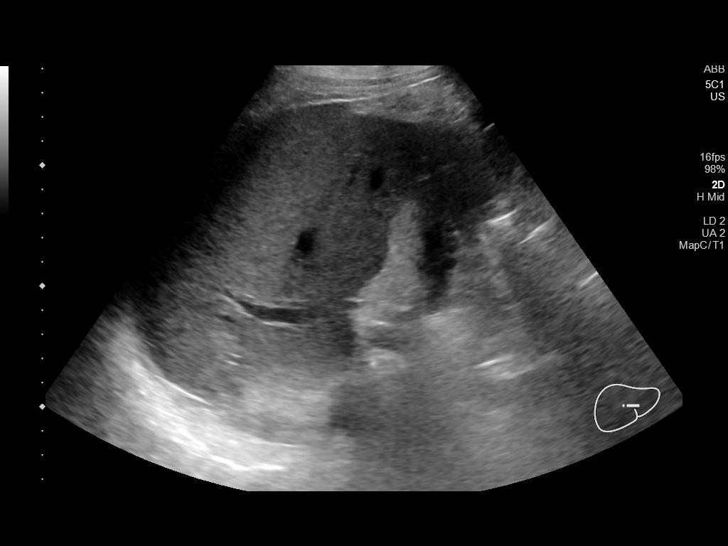
[im 34/46]
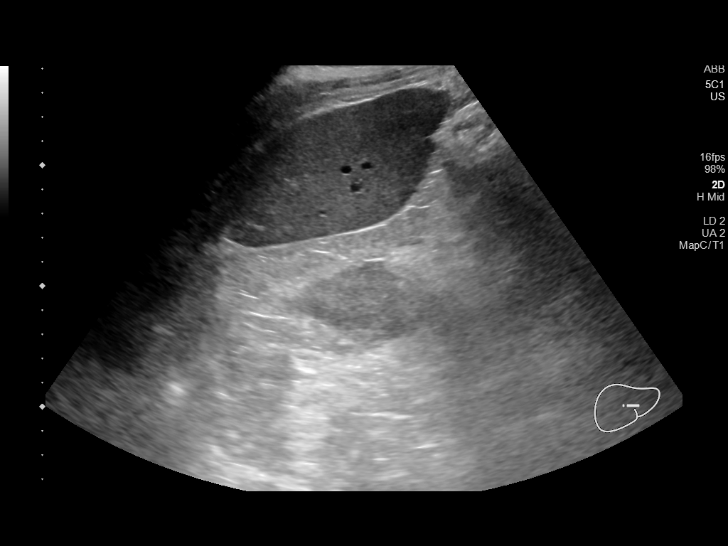
[im 38/46]
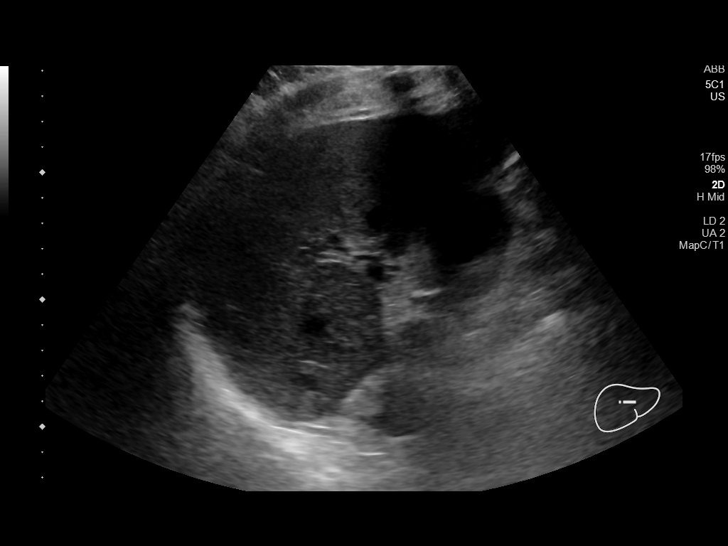
[im 42/46]
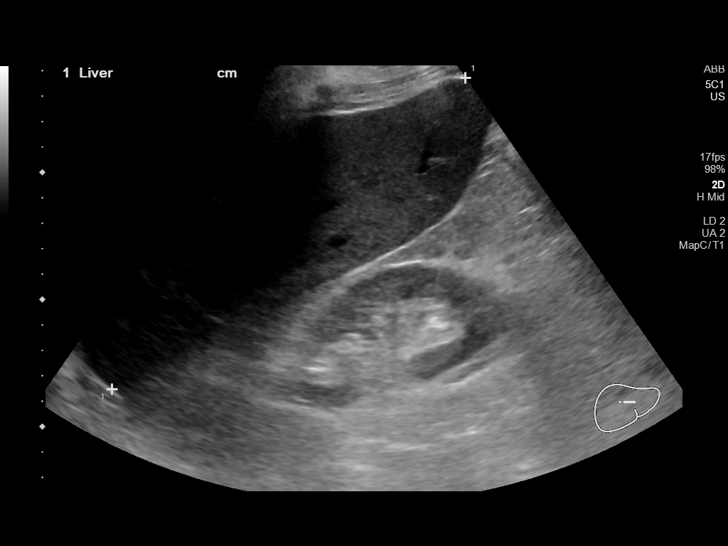
[im 46/46]
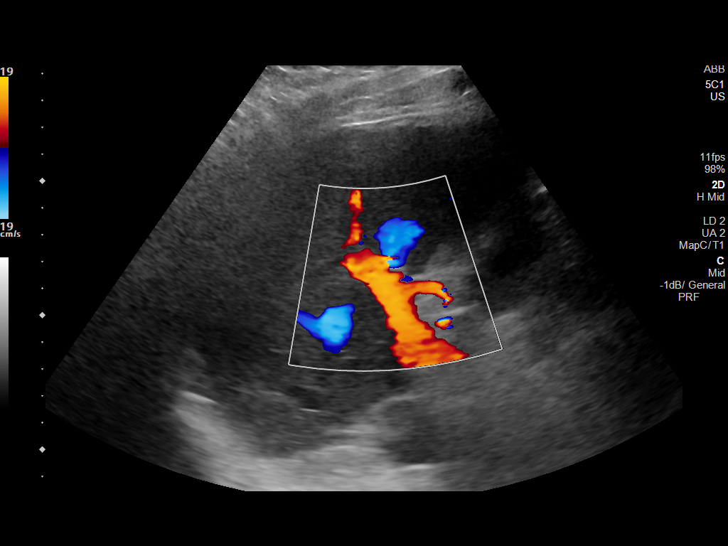

[14 of 25 positions shown; findings below may reference images not displayed]

FINDINGS: Gallbladder:

Small stones seen within the gallbladder. No wall thickening or
sonographic Murphy's sign. Small amount of pericholecystic fluid
suspected.

Common bile duct:

Diameter: Normal caliber, 4 mm

Liver:

No focal lesion identified. Within normal limits in parenchymal
echogenicity. Portal vein is patent on color Doppler imaging with
normal direction of blood flow towards the liver.

Other: None.
IMPRESSION: Cholelithiasis. There is no wall thickening or sonographic Murphy's
sign. There appears to be a small amount of pericholecystic fluid.
Recommend clinical correlation to completely exclude acute
cholecystitis.

## 2022-09-03 DEATH — deceased

## 2022-10-04 DEATH — deceased
